# Patient Record
Sex: Female | Born: 1990 | Hispanic: Yes | Marital: Married | Smoking: Never smoker
Health system: Southern US, Community
[De-identification: ages and names within clinical notes are randomized; demographics above are authoritative.]

## PROBLEM LIST (undated history)

## (undated) DIAGNOSIS — K2970 Gastritis, unspecified, without bleeding: Secondary | ICD-10-CM

## (undated) DIAGNOSIS — O1490 Unspecified pre-eclampsia, unspecified trimester: Secondary | ICD-10-CM

---

## 2016-02-18 ENCOUNTER — Emergency Department (HOSPITAL_COMMUNITY): Payer: Self-pay

## 2016-02-18 ENCOUNTER — Emergency Department (HOSPITAL_COMMUNITY)
Admission: EM | Admit: 2016-02-18 | Discharge: 2016-02-18 | Disposition: A | Discharge status: Home / Self Care | Payer: Self-pay | Attending: Emergency Medicine | Admitting: Emergency Medicine

## 2016-02-18 ENCOUNTER — Encounter (HOSPITAL_COMMUNITY): Payer: Self-pay | Admitting: Emergency Medicine

## 2016-02-18 DIAGNOSIS — R1013 Epigastric pain: Secondary | ICD-10-CM | POA: Insufficient documentation

## 2016-02-18 LAB — URINE MICROSCOPIC-ADD ON

## 2016-02-18 LAB — URINALYSIS, ROUTINE W REFLEX MICROSCOPIC
Bilirubin Urine: NEGATIVE
GLUCOSE, UA: NEGATIVE mg/dL
HGB URINE DIPSTICK: NEGATIVE
Ketones, ur: NEGATIVE mg/dL
Nitrite: NEGATIVE
Protein, ur: NEGATIVE mg/dL
SPECIFIC GRAVITY, URINE: 1.017 (ref 1.005–1.030)
pH: 7.5 (ref 5.0–8.0)

## 2016-02-18 LAB — COMPREHENSIVE METABOLIC PANEL
ALT: 38 U/L (ref 14–54)
ANION GAP: 5 (ref 5–15)
AST: 22 U/L (ref 15–41)
Albumin: 4.3 g/dL (ref 3.5–5.0)
Alkaline Phosphatase: 71 U/L (ref 38–126)
BUN: 13 mg/dL (ref 6–20)
CHLORIDE: 106 mmol/L (ref 101–111)
CO2: 25 mmol/L (ref 22–32)
Calcium: 9.5 mg/dL (ref 8.9–10.3)
Creatinine, Ser: 0.71 mg/dL (ref 0.44–1.00)
Glucose, Bld: 107 mg/dL — ABNORMAL HIGH (ref 65–99)
POTASSIUM: 3.9 mmol/L (ref 3.5–5.1)
SODIUM: 136 mmol/L (ref 135–145)
Total Bilirubin: 0.2 mg/dL — ABNORMAL LOW (ref 0.3–1.2)
Total Protein: 7.5 g/dL (ref 6.5–8.1)

## 2016-02-18 LAB — I-STAT BETA HCG BLOOD, ED (MC, WL, AP ONLY)

## 2016-02-18 LAB — CBC
HEMATOCRIT: 39.4 % (ref 36.0–46.0)
HEMOGLOBIN: 13.4 g/dL (ref 12.0–15.0)
MCH: 30.5 pg (ref 26.0–34.0)
MCHC: 34 g/dL (ref 30.0–36.0)
MCV: 89.5 fL (ref 78.0–100.0)
Platelets: 210 10*3/uL (ref 150–400)
RBC: 4.4 MIL/uL (ref 3.87–5.11)
RDW: 13.3 % (ref 11.5–15.5)
WBC: 7.7 10*3/uL (ref 4.0–10.5)

## 2016-02-18 LAB — LIPASE, BLOOD: LIPASE: 16 U/L (ref 11–51)

## 2016-02-18 MED ORDER — GI COCKTAIL ~~LOC~~
30.0000 mL | Freq: Once | ORAL | Status: AC
  Administered 2016-02-18: 30 mL via ORAL
  Filled 2016-02-18: qty 30

## 2016-02-18 MED ORDER — SUCRALFATE 1 G PO TABS: 1.0000 g | ORAL_TABLET | Freq: Four times a day (QID) | ORAL | 0 refills | Status: DC

## 2016-02-18 MED ORDER — HYDROMORPHONE HCL 1 MG/ML IJ SOLN
1.0000 mg | Freq: Once | INTRAMUSCULAR | Status: AC
  Administered 2016-02-18: 1 mg via INTRAVENOUS
  Filled 2016-02-18: qty 1

## 2016-02-18 MED ORDER — SODIUM CHLORIDE 0.9 % IV BOLUS (SEPSIS)
1000.0000 mL | Freq: Once | INTRAVENOUS | Status: AC
  Administered 2016-02-18: 1000 mL via INTRAVENOUS

## 2016-02-18 MED ORDER — FAMOTIDINE 20 MG PO TABS
40.0000 mg | ORAL_TABLET | Freq: Once | ORAL | Status: AC
  Administered 2016-02-18: 40 mg via ORAL
  Filled 2016-02-18: qty 2

## 2016-02-18 MED ORDER — ONDANSETRON HCL 4 MG/2ML IJ SOLN
4.0000 mg | Freq: Once | INTRAMUSCULAR | Status: AC
  Administered 2016-02-18: 4 mg via INTRAVENOUS
  Filled 2016-02-18: qty 2

## 2016-02-18 MED ORDER — FAMOTIDINE 20 MG PO TABS: 20.0000 mg | ORAL_TABLET | Freq: Two times a day (BID) | ORAL | 0 refills | Status: DC

## 2016-02-18 MED ORDER — HYDROCODONE-ACETAMINOPHEN 5-325 MG PO TABS: 2.0000 | ORAL_TABLET | ORAL | 0 refills | Status: DC | PRN

## 2016-02-18 MED ORDER — SODIUM CHLORIDE 0.9 % IV SOLN: INTRAVENOUS | Status: DC

## 2016-02-18 MED ORDER — SUCRALFATE 1 G PO TABS
1.0000 g | ORAL_TABLET | Freq: Once | ORAL | Status: AC
  Administered 2016-02-18: 1 g via ORAL
  Filled 2016-02-18: qty 1

## 2016-02-18 NOTE — ED Triage Notes (Signed)
Pt reports epigastric pain for the past 2 weeks but worse today. Not on menstrual cycle. No nv/d. Pt speaks Spanish.

## 2016-02-18 NOTE — ED Notes (Signed)
US at bedside. Attempted to reassess pain, but US speaking with pt via translator. Pt appears more relaxed.

## 2016-02-18 NOTE — ED Notes (Signed)
Pt unable to urinate at present time 

## 2016-02-18 NOTE — ED Notes (Signed)
Pt reports her pain decreased a little

## 2016-02-18 NOTE — ED Provider Notes (Signed)
[facility]-EMERGENCY DEPT Provider Note   CSN: [id number] Arrival date & time: 02/18/16  [id number]     History   Chief Complaint Chief Complaint  Patient presents with  . Abdominal Pain    HPI Chelsey Kemp is a 25 y.o. female.  25 year old female presents with upper quadrant pain 2 weeks. Pain is sharp and radiates to her epigastric area into her back. Symptoms are worse with food.  In associated with nausea but no vomiting. No fever or chills. No vaginal bleeding or discharge. No urinary symptoms. No prior history of abdominal surgeries. Use over-the-counter medications without relief.      History reviewed. No pertinent past medical history.  There are no active problems to display for this patient.   History reviewed. No pertinent surgical history.  OB History    Gravida Para Term Preterm AB Living   1             SAB TAB Ectopic Multiple Live Births                   Home Medications    Prior to Admission medications   Not on File    Family History History reviewed. No pertinent family history.  Social History Social History  Substance Use Topics  . Smoking status: Never Smoker  . Smokeless tobacco: Never Used  . Alcohol use No     Allergies   Patient has no known allergies.   Review of Systems Review of Systems  All other systems reviewed and are negative.    Physical Exam Updated Vital Signs BP 122/83 (BP Location: Right Arm)   Pulse 80   Temp 98.2 F (36.8 C) (Oral)   Resp 18   LMP 01/31/2016   SpO2 100%   Physical Exam  Constitutional: She is oriented to person, place, and time. She appears well-developed and well-nourished.  Non-toxic appearance. No distress.  HENT:  Head: Normocephalic and atraumatic.  Eyes: Conjunctivae, EOM and lids are normal. Pupils are equal, round, and reactive to light.  Neck: Normal range of motion. Neck supple. No tracheal deviation present. No thyroid mass present.  Cardiovascular: Normal rate,  regular rhythm and normal heart sounds.  Exam reveals no gallop.   No murmur heard. Pulmonary/Chest: Effort normal and breath sounds normal. No stridor. No respiratory distress. She has no decreased breath sounds. She has no wheezes. She has no rhonchi. She has no rales.  Abdominal: Soft. Normal appearance and bowel sounds are normal. She exhibits no distension. There is tenderness in the right upper quadrant and epigastric area. There is guarding and positive Murphy's sign. There is no rebound and no CVA tenderness.    Musculoskeletal: Normal range of motion. She exhibits no edema or tenderness.  Neurological: She is alert and oriented to person, place, and time. She has normal strength. No cranial nerve deficit or sensory deficit. GCS eye subscore is 4. GCS verbal subscore is 5. GCS motor subscore is 6.  Skin: Skin is warm and dry. No abrasion and no rash noted.  Psychiatric: She has a normal mood and affect. Her speech is normal and behavior is normal.  Nursing note and vitals reviewed.    ED Treatments / Results  Labs (all labs ordered are listed, but only abnormal results are displayed) Labs Reviewed  LIPASE, BLOOD  COMPREHENSIVE METABOLIC PANEL  CBC  URINALYSIS, ROUTINE W REFLEX MICROSCOPIC (NOT AT [facility])  I-STAT BETA HCG BLOOD, ED ([facility], [facility], [facility] ONLY)    EKG  EKG  Interpretation None       Radiology No results found.  Procedures Procedures (including critical care time)  Medications Ordered in ED Medications  sodium chloride 0.9 % bolus 1,000 mL (not administered)  0.9 %  sodium chloride infusion (not administered)  ondansetron (ZOFRAN) injection 4 mg (not administered)  HYDROmorphone (DILAUDID) injection 1 mg (not administered)     Initial Impression / Assessment and Plan / ED Course  I have reviewed the triage vital signs and the nursing notes.  Pertinent labs & imaging results that were available during my care of the patient were reviewed by me and considered  in my medical decision making (see chart for details).  Clinical Course     Translator used for this encounter. Ultrasound without acute findings. Hyperkalemia noted. White cells noted in urine but patient has no symptoms at this time. Patient given medications for GERD and feels better. Will get GI referral  Final Clinical Impressions(s) / ED Diagnoses   Final diagnoses:  None    New Prescriptions New Prescriptions   No medications on file     Lorre NickAnthony Airabella Barley, MD 02/18/16 1517

## 2016-03-02 ENCOUNTER — Emergency Department (HOSPITAL_COMMUNITY): Payer: Self-pay

## 2016-03-02 ENCOUNTER — Encounter (HOSPITAL_COMMUNITY): Payer: Self-pay | Admitting: Emergency Medicine

## 2016-03-02 ENCOUNTER — Inpatient Hospital Stay (HOSPITAL_COMMUNITY)
Admission: EM | Admit: 2016-03-02 | Discharge: 2016-03-08 | DRG: 417 | Disposition: A | Discharge status: Home / Self Care | Payer: Self-pay | Attending: Internal Medicine | Admitting: Internal Medicine

## 2016-03-02 DIAGNOSIS — I959 Hypotension, unspecified: Secondary | ICD-10-CM | POA: Diagnosis present

## 2016-03-02 DIAGNOSIS — Z6833 Body mass index (BMI) 33.0-33.9, adult: Secondary | ICD-10-CM

## 2016-03-02 DIAGNOSIS — R7989 Other specified abnormal findings of blood chemistry: Secondary | ICD-10-CM

## 2016-03-02 DIAGNOSIS — K8051 Calculus of bile duct without cholangitis or cholecystitis with obstruction: Secondary | ICD-10-CM | POA: Diagnosis present

## 2016-03-02 DIAGNOSIS — K859 Acute pancreatitis without necrosis or infection, unspecified: Secondary | ICD-10-CM | POA: Diagnosis not present

## 2016-03-02 DIAGNOSIS — K8050 Calculus of bile duct without cholangitis or cholecystitis without obstruction: Secondary | ICD-10-CM

## 2016-03-02 DIAGNOSIS — R935 Abnormal findings on diagnostic imaging of other abdominal regions, including retroperitoneum: Secondary | ICD-10-CM

## 2016-03-02 DIAGNOSIS — K759 Inflammatory liver disease, unspecified: Secondary | ICD-10-CM | POA: Diagnosis present

## 2016-03-02 DIAGNOSIS — Z419 Encounter for procedure for purposes other than remedying health state, unspecified: Secondary | ICD-10-CM

## 2016-03-02 DIAGNOSIS — R112 Nausea with vomiting, unspecified: Secondary | ICD-10-CM

## 2016-03-02 DIAGNOSIS — R51 Headache: Secondary | ICD-10-CM | POA: Diagnosis not present

## 2016-03-02 DIAGNOSIS — K8067 Calculus of gallbladder and bile duct with acute and chronic cholecystitis with obstruction: Principal | ICD-10-CM | POA: Diagnosis present

## 2016-03-02 DIAGNOSIS — K8021 Calculus of gallbladder without cholecystitis with obstruction: Secondary | ICD-10-CM

## 2016-03-02 DIAGNOSIS — K8000 Calculus of gallbladder with acute cholecystitis without obstruction: Secondary | ICD-10-CM | POA: Diagnosis present

## 2016-03-02 DIAGNOSIS — R945 Abnormal results of liver function studies: Secondary | ICD-10-CM

## 2016-03-02 DIAGNOSIS — R1011 Right upper quadrant pain: Secondary | ICD-10-CM

## 2016-03-02 DIAGNOSIS — E669 Obesity, unspecified: Secondary | ICD-10-CM | POA: Diagnosis present

## 2016-03-02 DIAGNOSIS — K2970 Gastritis, unspecified, without bleeding: Secondary | ICD-10-CM | POA: Diagnosis present

## 2016-03-02 DIAGNOSIS — R1010 Upper abdominal pain, unspecified: Secondary | ICD-10-CM

## 2016-03-02 HISTORY — DX: Unspecified pre-eclampsia, unspecified trimester: O14.90

## 2016-03-02 HISTORY — DX: Gastritis, unspecified, without bleeding: K29.70

## 2016-03-02 LAB — CBC WITH DIFFERENTIAL/PLATELET
Basophils Absolute: 0 10*3/uL (ref 0.0–0.1)
Basophils Relative: 0 %
EOS PCT: 0 %
Eosinophils Absolute: 0 10*3/uL (ref 0.0–0.7)
HCT: 40.1 % (ref 36.0–46.0)
Hemoglobin: 13.6 g/dL (ref 12.0–15.0)
LYMPHS ABS: 1.2 10*3/uL (ref 0.7–4.0)
LYMPHS PCT: 23 %
MCH: 30.3 pg (ref 26.0–34.0)
MCHC: 33.9 g/dL (ref 30.0–36.0)
MCV: 89.3 fL (ref 78.0–100.0)
MONOS PCT: 4 %
Monocytes Absolute: 0.2 10*3/uL (ref 0.1–1.0)
Neutro Abs: 3.6 10*3/uL (ref 1.7–7.7)
Neutrophils Relative %: 73 %
PLATELETS: 221 10*3/uL (ref 150–400)
RBC: 4.49 MIL/uL (ref 3.87–5.11)
RDW: 13.2 % (ref 11.5–15.5)
WBC: 5 10*3/uL (ref 4.0–10.5)

## 2016-03-02 LAB — RAPID HIV SCREEN (HIV 1/2 AB+AG)
HIV 1/2 ANTIBODIES: NONREACTIVE
HIV-1 P24 Antigen - HIV24: NONREACTIVE

## 2016-03-02 LAB — COMPREHENSIVE METABOLIC PANEL
ALBUMIN: 4.9 g/dL (ref 3.5–5.0)
ALT: 880 U/L — ABNORMAL HIGH (ref 14–54)
AST: 1372 U/L — AB (ref 15–41)
Alkaline Phosphatase: 119 U/L (ref 38–126)
Anion gap: 5 (ref 5–15)
BUN: 13 mg/dL (ref 6–20)
CHLORIDE: 107 mmol/L (ref 101–111)
CO2: 27 mmol/L (ref 22–32)
Calcium: 9.5 mg/dL (ref 8.9–10.3)
Creatinine, Ser: 0.71 mg/dL (ref 0.44–1.00)
GFR calc Af Amer: >60 mL/min (ref >60)
GLUCOSE: 115 mg/dL — AB (ref 65–99)
POTASSIUM: 4.1 mmol/L (ref 3.5–5.1)
SODIUM: 139 mmol/L (ref 135–145)
Total Bilirubin: 1.1 mg/dL (ref 0.3–1.2)
Total Protein: 7.7 g/dL (ref 6.5–8.1)

## 2016-03-02 LAB — URINALYSIS, ROUTINE W REFLEX MICROSCOPIC
Glucose, UA: NEGATIVE mg/dL
Ketones, ur: 15 mg/dL — AB
NITRITE: POSITIVE — AB
PH: 7.5 (ref 5.0–8.0)
Protein, ur: 100 mg/dL — AB
SPECIFIC GRAVITY, URINE: 1.022 (ref 1.005–1.030)

## 2016-03-02 LAB — URINE MICROSCOPIC-ADD ON

## 2016-03-02 LAB — PROTIME-INR
INR: 1
Prothrombin Time: 13.2 seconds (ref 11.4–15.2)

## 2016-03-02 LAB — I-STAT BETA HCG BLOOD, ED (MC, WL, AP ONLY): I-stat hCG, quantitative: <5 m[IU]/mL (ref <5)

## 2016-03-02 LAB — ACETAMINOPHEN LEVEL: Acetaminophen (Tylenol), Serum: <10 ug/mL — ABNORMAL LOW (ref 10–30)

## 2016-03-02 LAB — LIPASE, BLOOD: LIPASE: 23 U/L (ref 11–51)

## 2016-03-02 LAB — MONONUCLEOSIS SCREEN: MONO SCREEN: NEGATIVE

## 2016-03-02 MED ORDER — PROMETHAZINE HCL 25 MG PO TABS
25.0000 mg | ORAL_TABLET | Freq: Four times a day (QID) | ORAL | Status: DC | PRN
  Filled 2016-03-02: qty 1

## 2016-03-02 MED ORDER — ENOXAPARIN SODIUM 40 MG/0.4ML ~~LOC~~ SOLN
40.0000 mg | SUBCUTANEOUS | Status: DC
  Administered 2016-03-02 – 2016-03-03 (×2): 40 mg via SUBCUTANEOUS
  Filled 2016-03-02 (×2): qty 0.4

## 2016-03-02 MED ORDER — HYDROMORPHONE HCL 1 MG/ML IJ SOLN
1.0000 mg | INTRAMUSCULAR | Status: DC | PRN
  Administered 2016-03-03: 1 mg via INTRAVENOUS
  Filled 2016-03-02: qty 1

## 2016-03-02 MED ORDER — ONDANSETRON HCL 4 MG/2ML IJ SOLN
4.0000 mg | Freq: Once | INTRAMUSCULAR | Status: AC
  Administered 2016-03-02: 4 mg via INTRAVENOUS
  Filled 2016-03-02: qty 2

## 2016-03-02 MED ORDER — ONDANSETRON HCL 4 MG PO TABS
4.0000 mg | ORAL_TABLET | Freq: Three times a day (TID) | ORAL | Status: DC | PRN
  Administered 2016-03-06 – 2016-03-08 (×3): 8 mg via ORAL
  Filled 2016-03-02 (×3): qty 2

## 2016-03-02 MED ORDER — SODIUM CHLORIDE 0.9 % IV SOLN
INTRAVENOUS | Status: DC
  Administered 2016-03-02: 1000 mL via INTRAVENOUS
  Administered 2016-03-03: 23:00:00 via INTRAVENOUS
  Administered 2016-03-03: 1000 mL via INTRAVENOUS

## 2016-03-02 MED ORDER — PROMETHAZINE HCL 25 MG/ML IJ SOLN
12.5000 mg | Freq: Four times a day (QID) | INTRAMUSCULAR | Status: DC | PRN
  Administered 2016-03-02 – 2016-03-07 (×5): 12.5 mg via INTRAVENOUS
  Filled 2016-03-02 (×6): qty 1

## 2016-03-02 MED ORDER — SODIUM CHLORIDE 0.9 % IV BOLUS (SEPSIS)
1000.0000 mL | Freq: Once | INTRAVENOUS | Status: AC
  Administered 2016-03-02: 1000 mL via INTRAVENOUS

## 2016-03-02 MED ORDER — HYDROMORPHONE HCL 1 MG/ML IJ SOLN
1.0000 mg | Freq: Once | INTRAMUSCULAR | Status: AC
  Administered 2016-03-02: 1 mg via INTRAVENOUS
  Filled 2016-03-02: qty 1

## 2016-03-02 MED ORDER — SODIUM CHLORIDE 0.9 % IV SOLN
Freq: Once | INTRAVENOUS | Status: AC
  Administered 2016-03-02: 20:00:00 via INTRAVENOUS

## 2016-03-02 MED ORDER — PROMETHAZINE HCL 25 MG RE SUPP
25.0000 mg | Freq: Four times a day (QID) | RECTAL | Status: DC | PRN
  Filled 2016-03-02: qty 1

## 2016-03-02 MED ORDER — IBUPROFEN 200 MG PO TABS
400.0000 mg | ORAL_TABLET | Freq: Four times a day (QID) | ORAL | Status: DC | PRN
  Administered 2016-03-02 – 2016-03-03 (×2): 400 mg via ORAL
  Filled 2016-03-02 (×2): qty 2

## 2016-03-02 NOTE — H&P (Signed)
History and Physical  Patient Name: Chelsey Kemp     ZOX:[id number]    DOB: 05-Jan-1991    DOA: 03/02/2016 PCP: No PCP Per Patient   Patient coming from: Home  Chief Complaint: RUQ Abdominal pain  HPI: Chelsey Kemp is a 25 y.o. female with no significant past medical history who presents with 3 weeks right upper quadrant abdominal pain.  All history collected through telephonic video interpreter.  The patient was in her usual state of health until about one month ago when she started to develop discomfort in her right upper quadrant. This was initially moderate in intensity, and intermittent. She went to the emergency room where a right upper quadrant ultrasound was negative, LFTs were normal and she was discharged with sucralfate and antacid.  The pain persisted despite these treatments, and on the last 3 days, the pain has become intense, constant, worse with lying down, worse after eating, and associated with vomiting. She could no longer bear the pain so she came back to the ER.  ED course: -Afebrile, heart rate 60s, respirations and pulse ox normal, blood pressure 114/55 -Na 139, K 4.1, Cr 0.71, WBC 5K, Hgb 13.6 -AST and ALT were 1372 and 880, respectively, total bilirubin and alkaline phosphatase normal. -CT of the abdomen and pelvis showed gallstones but no other abnormality -Right upper quadrant ultrasound showed normal CBD and no gallstones or evidence of cholecystitis -The case was discussed with gastroenterology who recommended admission for pain control and fluids, and TRH was asked to evaluate    The patient denies Tylenol use. She is from [place], has lived in the [place] last 3 years. She believes that she was vaccinated for hepatitis A and B. She has never had a tattoo, used injection drugs, or exchanged sex for money. She has had no previous gallstones, and no one of her family has had any history of liver disease or gallbladder  disease.          ROS: Review of Systems  Constitutional: Negative for chills, fever, malaise/fatigue and weight loss.  Gastrointestinal: Positive for abdominal pain and vomiting. Negative for nausea.  Musculoskeletal: Negative for joint pain and myalgias.  Skin: Negative for rash (nor jaundice).  Neurological: Negative for dizziness and loss of consciousness.  Endo/Heme/Allergies: Does not bruise/bleed easily (nor lymphadenopathy).  All other systems reviewed and are negative.         Past Medical History:  Diagnosis Date  . Gastritis   . Pre-eclampsia     Past Surgical History:  Procedure Laterality Date  . CESAREAN SECTION      Social History: The patient lives with her family. She walks unassisted. She is from [place], has lived in [place] for 3 years.  Nonsmoker.  No alcohol.  No Known Allergies  Family history: Mother and father are healthy.  Prior to Admission medications   Medication Sig Start Date End Date Taking? Authorizing Provider  famotidine (PEPCID) 20 MG tablet Take 1 tablet (20 mg total) by mouth 2 (two) times daily. 02/18/16  Yes Lorre NickAnthony Allen, MD  HYDROcodone-acetaminophen (NORCO/VICODIN) 5-325 MG tablet Take 2 tablets by mouth every 4 (four) hours as needed. Patient not taking: Reported on 03/02/2016 02/18/16   Lorre NickAnthony Allen, MD  sucralfate (CARAFATE) 1 g tablet Take 1 tablet (1 g total) by mouth 4 (four) times daily. Patient not taking: Reported on 03/02/2016 02/18/16   Lorre NickAnthony Allen, MD       Physical Exam: BP 119/78 (BP Location: Left Arm)  Pulse 78   Temp 98.2 F (36.8 C) (Oral)   Resp 18   LMP 02/28/2016 Comment: pt does not speak english  SpO2 98%   Breastfeeding? Unknown Comment: neg preg test 03/02/2016  General appearance: Well-developed, obese adult female, alert and in no acute distress.   Eyes: Anicteric, conjunctiva pink, lids and lashes normal. PERRL.    ENT: No nasal deformity, discharge, epistaxis.  Hearing  normal. OP moist without lesions.   Neck: No neck masses.  Trachea midline.  No thyromegaly/tenderness. Lymph: No cervical or supraclavicular lymphadenopathy. Skin: Warm and dry.  No jaundice.  No suspicious rashes or lesions. Cardiac: RRR, nl S1-S2, no murmurs appreciated.  Capillary refill is brisk.  JVP not visible.  No LE edema.  Radial and DP pulses 2+ and symmetric. Respiratory: Normal respiratory rate and rhythm.  CTAB without rales or wheezes. Abdomen: Abdomen soft.  Moderate TTP in RUQ, no guarding or rebound or rigidity. No ascites, distension, hepatosplenomegaly.   MSK: No deformities or effusions.  No cyanosis or clubbing. Neuro: Cranial nerves normal.  Sensation intact to light touch. Speech is fluent.  Muscle strength normal.    Psych: Sensorium intact and responding to questions, attention normal.  Behavior appropriate.  Affect normal.  Judgment and insight appear normal.     Labs on Admission:  I have personally reviewed following labs and imaging studies: CBC:  Recent Labs Lab 03/02/16 1504  WBC 5.0  NEUTROABS 3.6  HGB 13.6  HCT 40.1  MCV 89.3  PLT 221   Basic Metabolic Panel:  Recent Labs Lab 03/02/16 1504  NA 139  K 4.1  CL 107  CO2 27  GLUCOSE 115*  BUN 13  CREATININE 0.71  CALCIUM 9.5   GFR: CrCl cannot be calculated (Unknown ideal weight.).  Liver Function Tests:  Recent Labs Lab 03/02/16 1504  AST 1,372*  ALT 880*  ALKPHOS 119  BILITOT 1.1  PROT 7.7  ALBUMIN 4.9    Recent Labs Lab 03/02/16 1900  LIPASE 23   No results for input(s): AMMONIA in the last 168 hours. Coagulation Profile:  Recent Labs Lab 03/02/16 1900  INR 1.00   Cardiac Enzymes: No results for input(s): CKTOTAL, CKMB, CKMBINDEX, TROPONINI in the last 168 hours. BNP (last 3 results) No results for input(s): PROBNP in the last 8760 hours. HbA1C: No results for input(s): HGBA1C in the last 72 hours. CBG: No results for input(s): GLUCAP in the last 168  hours. Lipid Profile: No results for input(s): CHOL, HDL, LDLCALC, TRIG, CHOLHDL, LDLDIRECT in the last 72 hours. Thyroid Function Tests: No results for input(s): TSH, T4TOTAL, FREET4, T3FREE, THYROIDAB in the last 72 hours. Anemia Panel: No results for input(s): VITAMINB12, FOLATE, FERRITIN, TIBC, IRON, RETICCTPCT in the last 72 hours. Sepsis Labs: Invalid input(s): PROCALCITONIN, LACTICIDVEN No results found for this or any previous visit (from the past 240 hour(s)).       Radiological Exams on Admission: Personally reviewed CT without contrast and RUQ US reports: Ct Renal Stone Study  Result Date: 03/02/2016 CLINICAL DATA:  Right flank pain. EXAM: CT ABDOMEN AND PELVIS WITHOUT CONTRAST TECHNIQUE: Multidetector CT imaging of the abdomen and pelvis was performed following the standard protocol without IV contrast. COMPARISON:  Abdominal ultrasound 02/18/2016 FINDINGS: Lower chest: Trace bilateral pleural fluid. No visualized pneumonia. Hepatobiliary: Negative liver. Cholelithiasis with calcified stone seen near the neck. Gallbladder is full but there is no convincing pericholecystic inflammation. Pancreas: Normal Spleen: Normal Adrenals/Urinary Tract: Negative adrenal glands. No hydronephrosis or renal  calculus. Negative urinary bladder. Stomach/Bowel: No obstruction or inflammation.  Normal appendix. Vascular/Lymphatic: Negative Reproductive: Negative Other: The lower peritoneum may be mildly thickened, suggesting previous inflammation. No ascites or pneumoperitoneum. Musculoskeletal: Negative IMPRESSION: 1. Cholelithiasis without evidence of cholecystitis. 2. Trace bilateral pleural fluid. Electronically Signed   By: Marnee Spring M.D.   On: 03/02/2016 17:18   US Abdomen Limited Ruq  Result Date: 03/02/2016 CLINICAL DATA:  Subacute onset of right upper quadrant abdominal pain. Initial encounter. EXAM: US ABDOMEN LIMITED - RIGHT UPPER QUADRANT COMPARISON:  CT of the abdomen and pelvis  performed earlier today at 5:00 p.m. FINDINGS: Gallbladder: Multiple stones are noted within the gallbladder, measuring up to 9 mm in size. No gallbladder wall thickening or pericholecystic fluid is seen. Mild sludge is noted within the gallbladder. No ultrasonographic Murphy's sign is elicited. Common bile duct: Diameter: 0.3 cm, within normal limits in caliber. Liver: No focal lesion identified. Within normal limits in parenchymal echogenicity. The left hepatic lobe is not well characterized due to overlying bowel gas. IMPRESSION: No evidence of cholecystitis or obstruction. Cholelithiasis and mild sludge within the gallbladder. Electronically Signed   By: Roanna Raider M.D.   On: 03/02/2016 19:32        Assessment/Plan  1. Hepatitis:  Working diagnosis is gallstone pancreatitis given gallstones seen on CT imaging and pain after eating.   -NPO and MIVF -Hydromorphone or ibuprofen for pain -Ondansetron or phenergan for nausea -Consult to Gastroenterology, appreciate recommendations -Trend CMP  -Follow lipase  -Check acetaminophen level -Check UDS -Check HIV -Follow viral hepatitis serologies -Check INR -Follow EBV screen          DVT prophylaxis: Lovenox  Code Status: FULL  Family Communication: None present  Disposition Plan: Anticipate GI consult tomorrow and presumably MRCP given normal RUQ [place].   Consults called: GI, Dr. Adela Lank Admission status: OBS, med surg At the point of initial evaluation, it is my clinical opinion that admission for OBSERVATION is reasonable and necessary because the patient's presenting complaints in the context of their chronic conditions represent sufficient risk of deterioration or significant morbidity to constitute reasonable grounds for close observation in the hospital setting, but that the patient may be medically stable for discharge from the hospital within 24 to 48 hours.    Medical decision making: Patient seen at 7:25 PM on  03/02/2016.  The patient was discussed with Dr. Donnald Garre.  What exists of the patient's chart was reviewed in depth and summarized above.  Clinical condition: stable.        Alberteen Sam [facility] Pager [phone]

## 2016-03-02 NOTE — ED Notes (Signed)
Writer attempted to assist pt to the bathroom for a urine sample, pt made hand motions suggesting that she wasn't feeling any better.

## 2016-03-02 NOTE — ED Provider Notes (Signed)
[facility]-EMERGENCY DEPT Provider Note   CSN: [id number] Arrival date & time: 03/02/16  1408     History   Chief Complaint Chief Complaint  Patient presents with  . Abdominal Pain    HPI Chelsey Kemp is a 25 y.o. female.  HPI Patient reports she has had pain in the right upper quadrant periodically for about a month. She reports she was seen in the hospital for this and has tried Nexium. She reports it has not helped at all. She however describes taking it periodically and not continuously. She reports the pain has become severe over the past 2-3 days. She reports when she gets severe episodes she vomits a little bit. She denies constipation or diarrhea. She denies pain with urination. She reports she is currently having a normal menstrual cycle. No fever. No cough. Pain is worse with deep inspiration and movements. Past Medical History:  Diagnosis Date  . Gastritis   . Pre-eclampsia     Patient Active Problem List   Diagnosis Date Noted  . Gall stones, common bile duct   . Choledocholithiasis with obstruction 03/04/2016  . Gastritis   . Non-intractable vomiting with nausea   . Elevated LFTs   . RUQ pain   . Acute cholecystitis s/p lap cholecystectomy 03/04/2016 03/02/2016    Past Surgical History:  Procedure Laterality Date  . CESAREAN SECTION    . LAPAROSCOPIC CHOLECYSTECTOMY W/ CHOLANGIOGRAPHY  03/04/2016   Dr Gaynelle Adu    OB History    Gravida Para Term Preterm AB Living   1             SAB TAB Ectopic Multiple Live Births                   Home Medications    Prior to Admission medications   Medication Sig Start Date End Date Taking? Authorizing Provider  famotidine (PEPCID) 20 MG tablet Take 1 tablet (20 mg total) by mouth 2 (two) times daily. 02/18/16  Yes Lorre Nick, MD    Family History History reviewed. No pertinent family history.  Social History Social History  Substance Use Topics  . Smoking status: Never Smoker  . Smokeless tobacco:  Never Used  . Alcohol use No     Allergies   Patient has no known allergies.   Review of Systems Review of Systems 10 Systems reviewed and are negative for acute change except as noted in the HPI.   Physical Exam Updated Vital Signs BP (!) 104/54 (BP Location: Right Arm)   Pulse (!) 52   Temp 98.6 F (37 C) (Oral)   Resp 16   Ht 5' (1.524 m)   Wt 169 lb (76.7 kg)   LMP 02/28/2016 Comment: pt does not speak english  SpO2 100%   Breastfeeding? Unknown Comment: neg preg test 03/02/2016  BMI 33.01 kg/m   Physical Exam  Constitutional: She is oriented to person, place, and time. She appears well-developed and well-nourished. No distress.  Patient is clinically well in appearance but appears to be in moderate to severe pain, holding her right upper quadrant.  HENT:  Head: Normocephalic and atraumatic.  Eyes: Conjunctivae and EOM are normal.  Neck: Neck supple.  Cardiovascular: Normal rate and regular rhythm.   No murmur heard. Pulmonary/Chest: Effort normal and breath sounds normal. No respiratory distress.  Abdominal: Soft. There is tenderness.  Moderate to severe epigastric and right upper quadrant tenderness. Lower abdomen is nontender. Positive right CVA tenderness. Patient also expresses pain with  sitting forward and moving back.  Musculoskeletal: Normal range of motion. She exhibits no tenderness.  Neurological: She is alert and oriented to person, place, and time. She exhibits normal muscle tone. Coordination normal.  Skin: Skin is warm and dry.  Psychiatric: She has a normal mood and affect.  Nursing note and vitals reviewed.    ED Treatments / Results  Labs (all labs ordered are listed, but only abnormal results are displayed) Labs Reviewed  URINE CULTURE - Abnormal; Notable for the following:       Result Value   Culture MULTIPLE SPECIES PRESENT, SUGGEST RECOLLECTION (*)    All other components within normal limits  COMPREHENSIVE METABOLIC PANEL -  Abnormal; Notable for the following:    Glucose, Bld 115 (*)    AST 1,372 (*)    ALT 880 (*)    All other components within normal limits  URINALYSIS, ROUTINE W REFLEX MICROSCOPIC (NOT AT [facility]) - Abnormal; Notable for the following:    Color, Urine RED (*)    APPearance TURBID (*)    Hgb urine dipstick LARGE (*)    Bilirubin Urine MODERATE (*)    Ketones, ur 15 (*)    Protein, ur 100 (*)    Nitrite POSITIVE (*)    Leukocytes, UA MODERATE (*)    All other components within normal limits  URINE MICROSCOPIC-ADD ON - Abnormal; Notable for the following:    Squamous Epithelial / LPF 6-30 (*)    Bacteria, UA FEW (*)    All other components within normal limits  ACETAMINOPHEN LEVEL - Abnormal; Notable for the following:    Acetaminophen (Tylenol), Serum <10 (*)    All other components within normal limits  RAPID URINE DRUG SCREEN, HOSP PERFORMED - Abnormal; Notable for the following:    Opiates POSITIVE (*)    All other components within normal limits  COMPREHENSIVE METABOLIC PANEL - Abnormal; Notable for the following:    Calcium 8.3 (*)    Total Protein 5.8 (*)    Albumin 3.4 (*)    AST 410 (*)    ALT 665 (*)    Total Bilirubin 1.6 (*)    Anion gap 3 (*)    All other components within normal limits  CBC - Abnormal; Notable for the following:    Hemoglobin 11.8 (*)    HCT 35.0 (*)    All other components within normal limits  COMPREHENSIVE METABOLIC PANEL - Abnormal; Notable for the following:    AST 170 (*)    ALT 464 (*)    Alkaline Phosphatase 145 (*)    Total Bilirubin 3.4 (*)    All other components within normal limits  CBC - Abnormal; Notable for the following:    WBC 10.8 (*)    HCT 35.9 (*)    All other components within normal limits  SURGICAL PCR SCREEN  CBC WITH DIFFERENTIAL/PLATELET  HEPATITIS PANEL, ACUTE  PROTIME-INR  LIPASE, BLOOD  MONONUCLEOSIS SCREEN  RAPID HIV SCREEN (HIV 1/2 AB+AG)  COMPREHENSIVE METABOLIC PANEL  I-STAT BETA HCG BLOOD, ED ([facility], [facility],  [facility] ONLY)  TYPE AND SCREEN  ABO/RH  SURGICAL PATHOLOGY    EKG  EKG Interpretation None       Radiology Dg Cholangiogram Operative  Result Date: 03/04/2016 CLINICAL DATA:  25 year old female with a history of cholecystectomy EXAM: INTRAOPERATIVE CHOLANGIOGRAM TECHNIQUE: Cholangiographic images from the C-arm fluoroscopic device were submitted for interpretation post-operatively. Please see the procedural report for the amount of contrast and the fluoroscopy time  utilized. COMPARISON:  Ultrasound 03/02/2016, CT 03/02/2016 FINDINGS: Surgical instruments project over the upper abdomen. There is cannulation of the cystic duct/gallbladder neck, with antegrade infusion of contrast. Caliber of the extrahepatic ductal system within normal limits. Vague filling defects within the common bile duct. Contrast does not cross the ampulla into the duodenum. Extraluminal contrast at the site of cystic duct cannulation and at the inferior liver margin. IMPRESSION: Intraoperative cholangiogram demonstrates no contrast traversing the ampulla with vague filling defects within the common bile duct potentially representing retained debris/ stones. Please refer to the dictated operative report for full details of intraoperative findings and procedure Signed, Yvone Neu. Loreta Ave, DO [facility] Radiology Electronically Signed   By: Gilmer Mor D.O.   On: 03/04/2016 09:20   Dg Ercp Biliary & Pancreatic Ducts  Result Date: 03/05/2016 CLINICAL DATA:  25 year old female undergoing sphincterotomy for stone removal EXAM: ERCP TECHNIQUE: Multiple spot images obtained with the fluoroscopic device and submitted for interpretation post-procedure. FLUOROSCOPY TIME:  Fluoroscopy Time:  6 minutes 31 seconds Radiation Exposure Index (if provided by the fluoroscopic device): 102.26 mGy COMPARISON:  Intraoperative cholangiogram 03/04/2016 FINDINGS: A total of 4 intraoperative spot images  demonstrate a flexible endoscope in the duodenum with cannulation of the common bile duct and contrast opacification. There is a rounded filling defect in the distal common duct consistent with choledocholithiasis. Subsequent images demonstrate sphincterotomy and balloon sweep of the common duct. IMPRESSION: 1. Choledocholithiasis with a small stone in the distal common bile duct. 2. Sphincterotomy and balloon sweep of the common duct. These images were submitted for radiologic interpretation only. Please see the procedural report for the amount of contrast and the fluoroscopy time utilized. Electronically Signed   By: Malachy Moan M.D.   On: 03/05/2016 14:56    Procedures Procedures (including critical care time)  Medications Ordered in ED Medications  ibuprofen (ADVIL,MOTRIN) tablet 400 mg ( Oral MAR Unhold 03/05/16 1437)  ondansetron (ZOFRAN) tablet 4-8 mg ( Oral MAR Unhold 03/05/16 1437)  promethazine (PHENERGAN) tablet 25 mg ( Oral MAR Unhold 03/05/16 1437)    Or  promethazine (PHENERGAN) injection 12.5 mg ( Intravenous MAR Unhold 03/05/16 1437)    Or  promethazine (PHENERGAN) suppository 25 mg ( Rectal MAR Unhold 03/05/16 1437)  Influenza vac split quadrivalent PF (FLUARIX) injection 0.5 mL ( Intramuscular MAR Unhold 03/05/16 1437)  0.9 %  sodium chloride infusion ( Intravenous Restarted 03/05/16 1451)  HYDROmorphone (DILAUDID) injection 0.5-1.5 mg (1 mg Intravenous Given 03/06/16 0006)  acetaminophen (TYLENOL) tablet 650 mg ( Oral MAR Unhold 03/05/16 1437)  HYDROmorphone (DILAUDID) 1 MG/ML injection (not administered)  cefTRIAXone (ROCEPHIN) 1 g in dextrose 5 % 50 mL IVPB (1 g Intravenous Given 03/05/16 1700)  HYDROmorphone (DILAUDID) injection 1 mg (1 mg Intravenous Given 03/02/16 1505)  sodium chloride 0.9 % bolus 1,000 mL (0 mLs Intravenous Stopped 03/02/16 1713)  ondansetron (ZOFRAN) injection 4 mg (4 mg Intravenous Given 03/02/16 1713)  0.9 %  sodium chloride infusion (  Intravenous New Bag/Given 03/02/16 1935)  cefoTEtan (CEFOTAN) 2 g in dextrose 5 % 50 mL IVPB ( Intravenous MAR Unhold 03/04/16 1027)     Initial Impression / Assessment and Plan / ED Course  I have reviewed the triage vital signs and the nursing notes.  Pertinent labs & imaging results that were available during my care of the patient were reviewed by me and considered in my medical decision making (see chart for details).  Clinical Course    Consult:(18:53)  Discussed with Dr. Adela LankArmbruster. Will add acute hepatitis panel and ultrasound. Plan to admit to medicine service. Nothing by mouth with pain control. She will be seen in the morning for further evaluation and possible MRCP.  Final Clinical Impressions(s) / ED Diagnoses   Final diagnoses:  RUQ pain  Abnormal CT of the abdomen  Surgery, elective  Hepatitis  Gall stones, common bile duct  Choledocholithiasis with obstruction    New Prescriptions Current Discharge Medication List       Arby BarretteMarcy Ashvik Grundman, MD 03/06/16 [id number]

## 2016-03-02 NOTE — ED Notes (Signed)
Patient ambulatory to the restroom.  Obtained urine specimen.

## 2016-03-02 NOTE — ED Triage Notes (Signed)
Pt reports through interpreter services that she began to have epigastric pain about 2 weeks ago, she was seen in the ED and received an Rx.  Pain improved with meds.  It began to worsen yesterday.  Pain is typically worse at night.  Pt has no PCP

## 2016-03-03 DIAGNOSIS — R112 Nausea with vomiting, unspecified: Secondary | ICD-10-CM

## 2016-03-03 DIAGNOSIS — R748 Abnormal levels of other serum enzymes: Secondary | ICD-10-CM

## 2016-03-03 DIAGNOSIS — R1011 Right upper quadrant pain: Secondary | ICD-10-CM

## 2016-03-03 DIAGNOSIS — R1010 Upper abdominal pain, unspecified: Secondary | ICD-10-CM

## 2016-03-03 DIAGNOSIS — R935 Abnormal findings on diagnostic imaging of other abdominal regions, including retroperitoneum: Secondary | ICD-10-CM

## 2016-03-03 DIAGNOSIS — K8020 Calculus of gallbladder without cholecystitis without obstruction: Secondary | ICD-10-CM

## 2016-03-03 LAB — RAPID URINE DRUG SCREEN, HOSP PERFORMED
AMPHETAMINES: NOT DETECTED
BARBITURATES: NOT DETECTED
BENZODIAZEPINES: NOT DETECTED
COCAINE: NOT DETECTED
OPIATES: POSITIVE — AB
TETRAHYDROCANNABINOL: NOT DETECTED

## 2016-03-03 LAB — COMPREHENSIVE METABOLIC PANEL
ALBUMIN: 3.4 g/dL — AB (ref 3.5–5.0)
ALT: 665 U/L — ABNORMAL HIGH (ref 14–54)
ANION GAP: 3 — AB (ref 5–15)
AST: 410 U/L — ABNORMAL HIGH (ref 15–41)
Alkaline Phosphatase: 120 U/L (ref 38–126)
BILIRUBIN TOTAL: 1.6 mg/dL — AB (ref 0.3–1.2)
BUN: 9 mg/dL (ref 6–20)
CO2: 24 mmol/L (ref 22–32)
Calcium: 8.3 mg/dL — ABNORMAL LOW (ref 8.9–10.3)
Chloride: 111 mmol/L (ref 101–111)
Creatinine, Ser: 0.5 mg/dL (ref 0.44–1.00)
Glucose, Bld: 94 mg/dL (ref 65–99)
POTASSIUM: 4 mmol/L (ref 3.5–5.1)
Sodium: 138 mmol/L (ref 135–145)
TOTAL PROTEIN: 5.8 g/dL — AB (ref 6.5–8.1)

## 2016-03-03 LAB — CBC
HEMATOCRIT: 35 % — AB (ref 36.0–46.0)
HEMOGLOBIN: 11.8 g/dL — AB (ref 12.0–15.0)
MCH: 30.3 pg (ref 26.0–34.0)
MCHC: 33.7 g/dL (ref 30.0–36.0)
MCV: 90 fL (ref 78.0–100.0)
Platelets: 184 10*3/uL (ref 150–400)
RBC: 3.89 MIL/uL (ref 3.87–5.11)
RDW: 13.4 % (ref 11.5–15.5)
WBC: 4 10*3/uL (ref 4.0–10.5)

## 2016-03-03 LAB — ABO/RH: ABO/RH(D): O POS

## 2016-03-03 LAB — TYPE AND SCREEN
ABO/RH(D): O POS
ANTIBODY SCREEN: NEGATIVE

## 2016-03-03 MED ORDER — INFLUENZA VAC SPLIT QUAD 0.5 ML IM SUSY
0.5000 mL | PREFILLED_SYRINGE | INTRAMUSCULAR | Status: DC
  Filled 2016-03-03: qty 0.5

## 2016-03-03 MED ORDER — CEFOTETAN DISODIUM 2 G IJ SOLR
2.0000 g | INTRAMUSCULAR | Status: AC
  Administered 2016-03-04: 2 g via INTRAVENOUS
  Filled 2016-03-03: qty 2

## 2016-03-03 NOTE — Consult Note (Signed)
Reason for Consult:biliary colic Referring Physician: Dr Lenice Pressman Chelsey Kemp is an 25 y.o. female.  HPI: This is a 25 year old non-English-speaking Hispanic female who reports about a 2 week history of upper abdominal pain mainly in the upper abdomen more so on the right side. It is associated with nausea at times. She has had vomiting at times with it. She went to the emergency room on November 10 with the same complaints. An ultrasound during that visit was unremarkable for cholelithiasis and she was discharged with PPI for presumed gastritis. She came back in last night for recurrent upper abdominal right-sided pain. It was worsening. She was found to have very elevated transaminases but a normal white blood cell count. Ultrasound this time showed numerous gallstones without evidence of choledocholithiasis or dilated common bile duct. CT also showed gallstones without any other abnormal pathology. Today her LFTs are trending down and it was felt that she had passed a gallstone and we were asked to evaluate her for symptomatic cholelithiasis  History obtained via video interpreter services Cristian [phone]  Past Medical History:  Diagnosis Date  . Gastritis   . Pre-eclampsia     Past Surgical History:  Procedure Laterality Date  . CESAREAN SECTION      History reviewed. No pertinent family history.  Social History:  reports that she has never smoked. She has never used smokeless tobacco. She reports that she does not drink alcohol. Her drug history is not on file.  Allergies: No Known Allergies  Medications: I have reviewed the patient's current medications.  Results for orders placed or performed during the hospital encounter of 03/02/16 (from the past 48 hour(s))  Comprehensive metabolic panel     Status: Abnormal   Collection Time: 03/02/16  3:04 PM  Result Value Ref Range   Sodium 139 135 - 145 mmol/L   Potassium 4.1 3.5 - 5.1 mmol/L   Chloride 107 101 - 111 mmol/L   CO2 27 22 - 32 mmol/L   Glucose, Bld 115 (H) 65 - 99 mg/dL   BUN 13 6 - 20 mg/dL   Creatinine, Ser 0.71 0.44 - 1.00 mg/dL   Calcium 9.5 8.9 - 10.3 mg/dL   Total Protein 7.7 6.5 - 8.1 g/dL   Albumin 4.9 3.5 - 5.0 g/dL   AST 1,372 (H) 15 - 41 U/L   ALT 880 (H) 14 - 54 U/L   Alkaline Phosphatase 119 38 - 126 U/L   Total Bilirubin 1.1 0.3 - 1.2 mg/dL   GFR calc non Af Amer >60 >60 mL/min   GFR calc Af Amer >60 >60 mL/min    Comment: (NOTE) The eGFR has been calculated using the CKD EPI equation. This calculation has not been validated in all clinical situations. eGFR's persistently <60 mL/min signify possible Chronic Kidney Disease.    Anion gap 5 5 - 15  CBC with Differential     Status: None   Collection Time: 03/02/16  3:04 PM  Result Value Ref Range   WBC 5.0 4.0 - 10.5 K/uL   RBC 4.49 3.87 - 5.11 MIL/uL   Hemoglobin 13.6 12.0 - 15.0 g/dL   HCT 40.1 36.0 - 46.0 %   MCV 89.3 78.0 - 100.0 fL   MCH 30.3 26.0 - 34.0 pg   MCHC 33.9 30.0 - 36.0 g/dL   RDW 13.2 11.5 - 15.5 %   Platelets 221 150 - 400 K/uL   Neutrophils Relative % 73 %   Neutro Abs 3.6 1.7 - 7.7  K/uL   Lymphocytes Relative 23 %   Lymphs Abs 1.2 0.7 - 4.0 K/uL   Monocytes Relative 4 %   Monocytes Absolute 0.2 0.1 - 1.0 K/uL   Eosinophils Relative 0 %   Eosinophils Absolute 0.0 0.0 - 0.7 K/uL   Basophils Relative 0 %   Basophils Absolute 0.0 0.0 - 0.1 K/uL  I-Stat beta hCG blood, ED     Status: None   Collection Time: 03/02/16  3:25 PM  Result Value Ref Range   I-stat hCG, quantitative <5.0 <5 mIU/mL   Comment 3            Comment:   GEST. AGE      CONC.  (mIU/mL)   <=1 WEEK        5 - 50     2 WEEKS       50 - 500     3 WEEKS       100 - 10,000     4 WEEKS     1,000 - 30,000        FEMALE AND NON-PREGNANT FEMALE:     LESS THAN 5 mIU/mL   Protime-INR     Status: None   Collection Time: 03/02/16  7:00 PM  Result Value Ref Range   Prothrombin Time 13.2 11.4 - 15.2 seconds   INR 1.00   Type and screen  [place]     Status: None   Collection Time: 03/02/16  7:00 PM  Result Value Ref Range   ABO/RH(D) O POS    Antibody Screen NEG    Sample Expiration 03/05/2016   Lipase, blood     Status: None   Collection Time: 03/02/16  7:00 PM  Result Value Ref Range   Lipase 23 11 - 51 U/L  Mononucleosis screen     Status: None   Collection Time: 03/02/16  7:00 PM  Result Value Ref Range   Mono Screen NEGATIVE NEGATIVE  ABO/Rh     Status: None   Collection Time: 03/02/16  7:01 PM  Result Value Ref Range   ABO/RH(D) O POS   Rapid HIV screen (HIV 1/2 Ab+Ag)     Status: None   Collection Time: 03/02/16  7:01 PM  Result Value Ref Range   HIV-1 P24 Antigen - HIV24 NON REACTIVE NON REACTIVE   HIV 1/2 Antibodies NON REACTIVE NON REACTIVE   Interpretation (HIV Ag Ab)      A non reactive test result means that HIV 1 or HIV 2 antibodies and HIV 1 p24 antigen were not detected in the specimen.    Comment: RESULT CALLED TO, READ BACK BY AND VERIFIED WITH: A DAVY RN [id number] 03/02/16 A NAVARRO   Urinalysis, Routine w reflex microscopic     Status: Abnormal   Collection Time: 03/02/16  7:22 PM  Result Value Ref Range   Color, Urine RED (A) YELLOW    Comment: BIOCHEMICALS MAY BE AFFECTED BY COLOR   APPearance TURBID (A) CLEAR   Specific Gravity, Urine 1.022 1.005 - 1.030   pH 7.5 5.0 - 8.0   Glucose, UA NEGATIVE NEGATIVE mg/dL   Hgb urine dipstick LARGE (A) NEGATIVE   Bilirubin Urine MODERATE (A) NEGATIVE   Ketones, ur 15 (A) NEGATIVE mg/dL   Protein, ur 100 (A) NEGATIVE mg/dL   Nitrite POSITIVE (A) NEGATIVE   Leukocytes, UA MODERATE (A) NEGATIVE  Urine microscopic-add on     Status: Abnormal   Collection Time: 03/02/16  7:22 PM  Result Value Ref Range   Squamous Epithelial / LPF 6-30 (A) NONE SEEN   WBC, UA 6-30 0 - 5 WBC/hpf   RBC / HPF TOO NUMEROUS TO COUNT 0 - 5 RBC/hpf   Bacteria, UA FEW (A) NONE SEEN  Rapid urine drug screen (hospital performed)     Status: Abnormal    Collection Time: 03/02/16  7:22 PM  Result Value Ref Range   Opiates POSITIVE (A) NONE DETECTED   Cocaine NONE DETECTED NONE DETECTED   Benzodiazepines NONE DETECTED NONE DETECTED   Amphetamines NONE DETECTED NONE DETECTED   Tetrahydrocannabinol NONE DETECTED NONE DETECTED   Barbiturates NONE DETECTED NONE DETECTED    Comment:        DRUG SCREEN FOR MEDICAL PURPOSES ONLY.  IF CONFIRMATION IS NEEDED FOR ANY PURPOSE, NOTIFY LAB WITHIN 5 DAYS.        LOWEST DETECTABLE LIMITS FOR URINE DRUG SCREEN Drug Class       Cutoff (ng/mL) Amphetamine      1000 Barbiturate      200 Benzodiazepine   323 Tricyclics       557 Opiates          300 Cocaine          300 THC              50   Acetaminophen level     Status: Abnormal   Collection Time: 03/02/16  8:06 PM  Result Value Ref Range   Acetaminophen (Tylenol), Serum <10 (L) 10 - 30 ug/mL    Comment:        THERAPEUTIC CONCENTRATIONS VARY SIGNIFICANTLY. A RANGE OF 10-30 ug/mL MAY BE AN EFFECTIVE CONCENTRATION FOR MANY PATIENTS. HOWEVER, SOME ARE BEST TREATED AT CONCENTRATIONS OUTSIDE THIS RANGE. ACETAMINOPHEN CONCENTRATIONS >150 ug/mL AT 4 HOURS AFTER INGESTION AND >50 ug/mL AT 12 HOURS AFTER INGESTION ARE OFTEN ASSOCIATED WITH TOXIC REACTIONS.   Comprehensive metabolic panel     Status: Abnormal   Collection Time: 03/03/16  5:03 AM  Result Value Ref Range   Sodium 138 135 - 145 mmol/L   Potassium 4.0 3.5 - 5.1 mmol/L   Chloride 111 101 - 111 mmol/L   CO2 24 22 - 32 mmol/L   Glucose, Bld 94 65 - 99 mg/dL   BUN 9 6 - 20 mg/dL   Creatinine, Ser 0.50 0.44 - 1.00 mg/dL   Calcium 8.3 (L) 8.9 - 10.3 mg/dL   Total Protein 5.8 (L) 6.5 - 8.1 g/dL   Albumin 3.4 (L) 3.5 - 5.0 g/dL   AST 410 (H) 15 - 41 U/L   ALT 665 (H) 14 - 54 U/L   Alkaline Phosphatase 120 38 - 126 U/L   Total Bilirubin 1.6 (H) 0.3 - 1.2 mg/dL   GFR calc non Af Amer >60 >60 mL/min   GFR calc Af Amer >60 >60 mL/min    Comment: (NOTE) The eGFR has been  calculated using the CKD EPI equation. This calculation has not been validated in all clinical situations. eGFR's persistently <60 mL/min signify possible Chronic Kidney Disease.    Anion gap 3 (L) 5 - 15  CBC     Status: Abnormal   Collection Time: 03/03/16  5:03 AM  Result Value Ref Range   WBC 4.0 4.0 - 10.5 K/uL   RBC 3.89 3.87 - 5.11 MIL/uL   Hemoglobin 11.8 (L) 12.0 - 15.0 g/dL   HCT 35.0 (L) 36.0 - 46.0 %   MCV 90.0 78.0 - 100.0 fL  MCH 30.3 26.0 - 34.0 pg   MCHC 33.7 30.0 - 36.0 g/dL   RDW 13.4 11.5 - 15.5 %   Platelets 184 150 - 400 K/uL    Ct Renal Stone Study  Result Date: 03/02/2016 CLINICAL DATA:  Right flank pain. EXAM: CT ABDOMEN AND PELVIS WITHOUT CONTRAST TECHNIQUE: Multidetector CT imaging of the abdomen and pelvis was performed following the standard protocol without IV contrast. COMPARISON:  Abdominal ultrasound 02/18/2016 FINDINGS: Lower chest: Trace bilateral pleural fluid. No visualized pneumonia. Hepatobiliary: Negative liver. Cholelithiasis with calcified stone seen near the neck. Gallbladder is full but there is no convincing pericholecystic inflammation. Pancreas: Normal Spleen: Normal Adrenals/Urinary Tract: Negative adrenal glands. No hydronephrosis or renal calculus. Negative urinary bladder. Stomach/Bowel: No obstruction or inflammation.  Normal appendix. Vascular/Lymphatic: Negative Reproductive: Negative Other: The lower peritoneum may be mildly thickened, suggesting previous inflammation. No ascites or pneumoperitoneum. Musculoskeletal: Negative IMPRESSION: 1. Cholelithiasis without evidence of cholecystitis. 2. Trace bilateral pleural fluid. Electronically Signed   By: Monte Fantasia M.D.   On: 03/02/2016 17:18   US Abdomen Limited Ruq  Result Date: 03/02/2016 CLINICAL DATA:  Subacute onset of right upper quadrant abdominal pain. Initial encounter. EXAM: US ABDOMEN LIMITED - RIGHT UPPER QUADRANT COMPARISON:  CT of the abdomen and pelvis performed  earlier today at 5:00 p.m. FINDINGS: Gallbladder: Multiple stones are noted within the gallbladder, measuring up to 9 mm in size. No gallbladder wall thickening or pericholecystic fluid is seen. Mild sludge is noted within the gallbladder. No ultrasonographic Murphy's sign is elicited. Common bile duct: Diameter: 0.3 cm, within normal limits in caliber. Liver: No focal lesion identified. Within normal limits in parenchymal echogenicity. The left hepatic lobe is not well characterized due to overlying bowel gas. IMPRESSION: No evidence of cholecystitis or obstruction. Cholelithiasis and mild sludge within the gallbladder. Electronically Signed   By: Garald Balding M.D.   On: 03/02/2016 19:32    Review of Systems  Constitutional: Negative for weight loss.  HENT: Negative for nosebleeds.   Eyes: Negative for blurred vision.  Respiratory: Negative for shortness of breath.   Cardiovascular: Negative for chest pain, palpitations, orthopnea and PND.       Denies DOE  Gastrointestinal: Positive for abdominal pain, nausea and vomiting. Negative for blood in stool and melena.  Genitourinary: Negative for dysuria and hematuria.  Musculoskeletal: Negative.   Skin: Negative for itching and rash.  Neurological: Negative for dizziness, focal weakness, seizures, loss of consciousness and headaches.  Endo/Heme/Allergies: Does not bruise/bleed easily.  Psychiatric/Behavioral: The patient is not nervous/anxious.    Blood pressure 109/69, pulse 61, temperature 98.1 F (36.7 C), temperature source Oral, resp. rate 16, last menstrual period 02/28/2016, SpO2 99 %, unknown if currently breastfeeding. Physical Exam  Vitals reviewed. Constitutional: She is oriented to person, place, and time. She appears well-developed and well-nourished. No distress.  obese  HENT:  Head: Normocephalic and atraumatic.  Right Ear: External ear normal.  Left Ear: External ear normal.  Eyes: Conjunctivae are normal. No scleral  icterus.  Neck: Normal range of motion. Neck supple. No tracheal deviation present. No thyromegaly present.  Cardiovascular: Normal rate and normal heart sounds.   Respiratory: Effort normal and breath sounds normal. No stridor. No respiratory distress. She has no wheezes.  GI: Soft. She exhibits no distension. There is tenderness in the right upper quadrant and epigastric area. There is no rigidity, no rebound and no guarding.    Musculoskeletal: She exhibits no edema or tenderness.  Lymphadenopathy:  She has no cervical adenopathy.  Neurological: She is alert and oriented to person, place, and time. She exhibits normal muscle tone.  Skin: Skin is warm and dry. No rash noted. She is not diaphoretic. No erythema. No pallor.  Psychiatric: She has a normal mood and affect. Her behavior is normal. Judgment and thought content normal.    Assessment/Plan: Symptomatic cholelithiasis  Possible early cholecystitis Elevated transaminases  I believe the patient's symptoms are consistent with gallbladder disease. I believe she probably passed a gallstone. Right now there is no overt evidence of common bile duct stone. We did talk about the possibility of needing additional procedures after cholecystectomy should her intraoperative cholangiogram be positive  We discussed gallbladder disease. We discussed non-operative and operative management. We discussed the signs & symptoms of acute cholecystitis  I discussed laparoscopic cholecystectomy with IOC in detail.  The patient was shown diagrams detailing the procedure.  We discussed the risks and benefits of a laparoscopic cholecystectomy including, but not limited to bleeding, infection, injury to surrounding structures such as the intestine or liver, bile leak, retained gallstones, need to convert to an open procedure, prolonged diarrhea, blood clots such as  DVT, common bile duct injury, anesthesia risks, and possible need for additional procedures.   We discussed the typical post-operative recovery course. I explained that the likelihood of improvement of their symptoms is good.  We will plan for laparoscopic cholecystectomy with cholangiogram tomorrow morning. She can have clear liquids today and nothing by mouth after midnight. IV antibiotics on call to surgery. She can have a dose of chemical DVT prophylaxis today which I will order  Chelsey Kemp. Redmond Pulling, MD, FACS General, Bariatric, & Minimally Invasive Surgery [facility], [place]    [facility] M 03/03/2016, 2:25 PM

## 2016-03-03 NOTE — Progress Notes (Signed)
[facility] PROGRESS NOTE  Chelsey NevinBelkis Kemp ZOX:[id number] DOB: [birth year] DOA: 03/02/2016 PCP: No PCP Per Patient  Interim summary and HPI 25 y.o. female with no significant past medical history who presents with 3 weeks right upper quadrant abdominal pain This was initially moderate in intensity, and intermittent. She went to the emergency room where a right upper quadrant ultrasound was negative, LFTs were normal and she was discharged with sucralfate and antacid. The pain persisted despite these treatments, and on the last 3 days, the pain has become intense, constant, worse with lying down, worse after eating, and associated with vomiting. She could no longer bear the pain so she came back to the ER.  Assessment/Plan: 1-cholelithiasis and biliary colic -patient w/o icterus -still with ongoing RUQ pain -no fever -CCS consulted and with plans to performed laparoscopic cholecystectomy with IOC on 11/25 -will follow rec's; NPO after midnight -continue PRN pain meds and supportive care  2-transaminitis -significantly elevated AST/ALT -most likely passive congestion from cholelithiasis and passing a gallstone  -LFT's improving/trending down -hepatitis panel pending (but giving presentation less likely) -EBV screen neg -HIV neg [facility] -will follow further rec's and if needed ERCP  3-N/V -most likely associated with #1 -will use PRN antiemetics  Code Status: Full Family Communication: friend at bedside  Disposition Plan: home when medically stable; laparoscopic surgery planned for 11/25, will follow up GI and general surgery rec's.   Consultants:  General Surgery  GI  Procedures:  See below for x-ray reports  Laparoscopic cholecystectomy with IOC (planned for 11/25)  Antibiotics:  None   HPI/Subjective: Afebrile, no CP and no SOB. Complaining of RUQ pain  Objective: Vitals:   03/03/16 1415 03/03/16 2045  BP:  109/69 116/68  Pulse: 61 67  Resp: 16 16  Temp: 98.1 F (36.7 C) 98.2 F (36.8 C)    Intake/Output Summary (Last 24 hours) at 03/03/16 2336 Last data filed at 03/03/16 2200  Gross per 24 hour  Intake          3087.08 ml  Output              350 ml  Net          2737.08 ml   There were no vitals filed for this visit.  Exam:   General:  Afebrile, complaining of RUQ pain, no CP, no SOB; denies nausea and vomiting. Endorses poor appetite  Cardiovascular: S1 and S2, no rubs, no gallops  Respiratory: CTA bilaterally  Abdomen: soft, mild guarding, tender to palpation on RUQ area and mid epigastric region positive BS  Musculoskeletal: no edema, no cyanosis   Data Reviewed: Basic Metabolic Panel:  Recent Labs Lab 03/02/16 1504 03/03/16 0503  NA 139 138  K 4.1 4.0  CL 107 111  CO2 27 24  GLUCOSE 115* 94  BUN 13 9  CREATININE 0.71 0.50  CALCIUM 9.5 8.3*   Liver Function Tests:  Recent Labs Lab 03/02/16 1504 03/03/16 0503  AST 1,372* 410*  ALT 880* 665*  ALKPHOS 119 120  BILITOT 1.1 1.6*  PROT 7.7 5.8*  ALBUMIN 4.9 3.4*    Recent Labs Lab 03/02/16 1900  LIPASE 23   CBC:  Recent Labs Lab 03/02/16 1504 03/03/16 0503  WBC 5.0 4.0  NEUTROABS 3.6  --   HGB 13.6 11.8*  HCT 40.1 35.0*  MCV 89.3 90.0  PLT 221 184    Studies: Ct Renal Stone Study  Result Date: 03/02/2016 CLINICAL DATA:  Right  flank pain. EXAM: CT ABDOMEN AND PELVIS WITHOUT CONTRAST TECHNIQUE: Multidetector CT imaging of the abdomen and pelvis was performed following the standard protocol without IV contrast. COMPARISON:  Abdominal ultrasound 02/18/2016 FINDINGS: Lower chest: Trace bilateral pleural fluid. No visualized pneumonia. Hepatobiliary: Negative liver. Cholelithiasis with calcified stone seen near the neck. Gallbladder is full but there is no convincing pericholecystic inflammation. Pancreas: Normal Spleen: Normal Adrenals/Urinary Tract: Negative adrenal glands. No hydronephrosis  or renal calculus. Negative urinary bladder. Stomach/Bowel: No obstruction or inflammation.  Normal appendix. Vascular/Lymphatic: Negative Reproductive: Negative Other: The lower peritoneum may be mildly thickened, suggesting previous inflammation. No ascites or pneumoperitoneum. Musculoskeletal: Negative IMPRESSION: 1. Cholelithiasis without evidence of cholecystitis. 2. Trace bilateral pleural fluid. Electronically Signed   By: Marnee SpringJonathon  Watts M.D.   On: 03/02/2016 17:18   [place] Abdomen Limited Ruq  Result Date: 03/02/2016 CLINICAL DATA:  Subacute onset of right upper quadrant abdominal pain. Initial encounter. EXAM: US ABDOMEN LIMITED - RIGHT UPPER QUADRANT COMPARISON:  CT of the abdomen and pelvis performed earlier today at 5:00 p.m. FINDINGS: Gallbladder: Multiple stones are noted within the gallbladder, measuring up to 9 mm in size. No gallbladder wall thickening or pericholecystic fluid is seen. Mild sludge is noted within the gallbladder. No ultrasonographic Murphy's sign is elicited. Common bile duct: Diameter: 0.3 cm, within normal limits in caliber. Liver: No focal lesion identified. Within normal limits in parenchymal echogenicity. The left hepatic lobe is not well characterized due to overlying bowel gas. IMPRESSION: No evidence of cholecystitis or obstruction. Cholelithiasis and mild sludge within the gallbladder. Electronically Signed   By: Roanna RaiderJeffery  Chang M.D.   On: 03/02/2016 19:32    Scheduled Meds: . [START ON 03/04/2016] cefoTEtan (CEFOTAN) IV  2 g Intravenous On Call to OR  . enoxaparin (LOVENOX) injection  40 mg Subcutaneous Q24H  . [START ON 03/04/2016] Influenza vac split quadrivalent PF  0.5 mL Intramuscular Tomorrow-1000   Continuous Infusions: . sodium chloride 125 mL/hr at 03/03/16 2300    Principal Problem:   Hepatitis Active Problems:   Calculus of gallbladder without cholecystitis without obstruction   RUQ pain   Abnormal CT of the abdomen    Time spent: 25  minutes    Vassie LollMadera, Johnryan Sao  [facility] Pager [phone]. If 7PM-7AM, please contact night-coverage at [email or web address], password [email or web address] 03/03/2016, 11:36 PM  LOS: 0 days

## 2016-03-03 NOTE — Consult Note (Signed)
Referring Provider: [facility] Primary Care Physician:  No PCP Per Patient Primary Gastroenterologist:   Gentry FitzUnassigned.  Reason for Consultation:  Elevated LFTs / Abdominal pain  HPI: Chelsey Kemp is a 25 y.o. Hispanic, non - English speaking female who presented to ED on 11/10 with upper abdominal pain. Workup was unremarkable and she was discharged with recommendations to see GI. Patient represented to ED yesterday with worsening pain, LFT were markedly elevated. RUQ confirmed cholelithiasis but no bile duct dilation.   History obtained using [employer] Marius. Her pain has been constant in 3-4 weeks. It is located in RUQ, frequently radiates through to her back and worse with meals. She has had associated nausea and vomiting. No fevers / chills. Patient doesn't drink ETOH, takes Tylenol a couple of times a month for menstrual pain. No herbs. No rx meds. No known liver disease. No FMH or liver disease. Patient [place] GI in [place] country 4 [place] for treatment of gastritis.   Past Medical History:  Diagnosis Date  . Gastritis   . Pre-eclampsia     Past Surgical History:  Procedure Laterality Date  . CESAREAN SECTION      Prior to Admission medications   Medication Sig Start Date End Date Taking? Authorizing Provider  famotidine (PEPCID) 20 MG tablet Take 1 tablet (20 mg total) by mouth 2 (two) times daily. 02/18/16  Yes Lorre NickAnthony Allen, MD    Current Facility-Administered Medications  Medication Dose Route Frequency Provider Last Rate Last Dose  . 0.9 %  sodium chloride infusion   Intravenous Continuous Alberteen Samhristopher P Danford, MD 125 mL/hr at 03/03/16 0556 1,000 mL at 03/03/16 0556  . enoxaparin (LOVENOX) injection 40 mg  40 mg Subcutaneous Q24H Alberteen Samhristopher P Danford, MD   40 mg at 03/02/16 2148  . HYDROmorphone (DILAUDID) injection 1 mg  1 mg Intravenous Q4H PRN Alberteen Samhristopher P Danford, MD      . ibuprofen (ADVIL,MOTRIN) tablet 400 mg  400 mg Oral Q6H PRN Alberteen Samhristopher P  Danford, MD   400 mg at 03/02/16 2148  . [START ON 03/04/2016] Influenza vac split quadrivalent PF (FLUARIX) injection 0.5 mL  0.5 mL Intramuscular Tomorrow-1000 Rhetta MuraJai-Gurmukh Samtani, MD      . ondansetron (ZOFRAN) tablet 4-8 mg  4-8 mg Oral Q8H PRN Alberteen Samhristopher P Danford, MD      . promethazine (PHENERGAN) tablet 25 mg  25 mg Oral Q6H PRN Alberteen Samhristopher P Danford, MD       Or  . promethazine (PHENERGAN) injection 12.5 mg  12.5 mg Intravenous Q6H PRN Alberteen Samhristopher P Danford, MD   12.5 mg at 03/02/16 2148   Or  . promethazine (PHENERGAN) suppository 25 mg  25 mg Rectal Q6H PRN Alberteen Samhristopher P Danford, MD        Allergies as of 03/02/2016  . (No Known Allergies)   FMH: No known gallbladder or liver diseases   Social History   Social History  . Marital status: Single    Spouse name: N/A  . Number of children: N/A  . Years of education: N/A   Occupational History  . Not on file.   Social History Main Topics  . Smoking status: Never Smoker  . Smokeless tobacco: Never Used  . Alcohol use No  . Drug use: Unknown  . Sexual activity: Not on file   Other Topics Concern  . Not on file   Social History Narrative  . No narrative on file    Review of Systems: All systems reviewed and negative  except where noted in HPI.  Physical Exam: Vital signs in last 24 hours: Temp:  [97.8 F (36.6 C)-98.6 F (37 C)] 98.6 F (37 C) (11/24 0442) Pulse Rate:  [61-78] 61 (11/23 2127) Resp:  [15-20] 15 (11/24 0442) BP: (96-127)/(52-78) 96/52 (11/24 0442) SpO2:  [96 %-100 %] 100 % (11/24 0442) Last BM Date: 02/28/16 General:   Well-developed Hispanic female in NAD Head:  Normocephalic and atraumatic. Eyes:  Sclera clear, no icterus.   Conjunctiva pink. Ears:  Normal auditory acuity. Nose:  No deformity, discharge,  or lesions. Mouth:  No deformity or lesions.   Neck:  Supple; no masses  Lungs:  Clear throughout to auscultation.   No wheezes, crackles, or rhonchi.  Heart:  Regular rate and  rhythm; no murmurs, clicks, rubs,  or gallops. Abdomen:  Soft,mild RUQ tenderness.  BS active, nonpalp mass or hsm.   Rectal:  Deferred  Msk:  Symmetrical without gross deformities. . Pulses:  Normal pulses noted. Extremities:  Without clubbing or edema. Neurologic:  Alert and  oriented x4;  grossly normal neurologically. Skin:  Intact without significant lesions or rashes.. Psych:  Alert and cooperative. Normal mood and affect.  Intake/Output from previous day: 11/23 0701 - 11/24 0700 In: 1147.1 [P.O.:120; I.V.:1027.1] Out: 0  Intake/Output this shift: No intake/output data recorded.  Lab Results:  Recent Labs  03/02/16 1504 03/03/16 0503  WBC 5.0 4.0  HGB 13.6 11.8*  HCT 40.1 35.0*  PLT 221 184   BMET  Recent Labs  03/02/16 1504 03/03/16 0503  NA 139 138  K 4.1 4.0  CL 107 111  CO2 27 24  GLUCOSE 115* 94  BUN 13 9  CREATININE 0.71 0.50  CALCIUM 9.5 8.3*   LFT  Recent Labs  03/03/16 0503  PROT 5.8*  ALBUMIN 3.4*  AST 410*  ALT 665*  ALKPHOS 120  BILITOT 1.6*   PT/INR  Recent Labs  03/02/16 1900  LABPROT 13.2  INR 1.00    Studies/Results: Ct Renal Stone Study  Result Date: 03/02/2016 CLINICAL DATA:  Right flank pain. EXAM: CT ABDOMEN AND PELVIS WITHOUT CONTRAST TECHNIQUE: Multidetector CT imaging of the abdomen and pelvis was performed following the standard protocol without IV contrast. COMPARISON:  Abdominal ultrasound 02/18/2016 FINDINGS: Lower chest: Trace bilateral pleural fluid. No visualized pneumonia. Hepatobiliary: Negative liver. Cholelithiasis with calcified stone seen near the neck. Gallbladder is full but there is no convincing pericholecystic inflammation. Pancreas: Normal Spleen: Normal Adrenals/Urinary Tract: Negative adrenal glands. No hydronephrosis or renal calculus. Negative urinary bladder. Stomach/Bowel: No obstruction or inflammation.  Normal appendix. Vascular/Lymphatic: Negative Reproductive: Negative Other: The lower  peritoneum may be mildly thickened, suggesting previous inflammation. No ascites or pneumoperitoneum. Musculoskeletal: Negative IMPRESSION: 1. Cholelithiasis without evidence of cholecystitis. 2. Trace bilateral pleural fluid. Electronically Signed   By: Marnee SpringJonathon  Watts M.D.   On: 03/02/2016 17:18   [place] Abdomen Limited Ruq  Result Date: 03/02/2016 CLINICAL DATA:  Subacute onset of right upper quadrant abdominal pain. Initial encounter. EXAM: US ABDOMEN LIMITED - RIGHT UPPER QUADRANT COMPARISON:  CT of the abdomen and pelvis performed earlier today at 5:00 p.m. FINDINGS: Gallbladder: Multiple stones are noted within the gallbladder, measuring up to 9 mm in size. No gallbladder wall thickening or pericholecystic fluid is seen. Mild sludge is noted within the gallbladder. No ultrasonographic Murphy's sign is elicited. Common bile duct: Diameter: 0.3 cm, within normal limits in caliber. Liver: No focal lesion identified. Within normal limits in parenchymal echogenicity. The left hepatic lobe is  not well characterized due to overlying bowel gas. IMPRESSION: No evidence of cholecystitis or obstruction. Cholelithiasis and mild sludge within the gallbladder. Electronically Signed   By: Roanna Raider M.D.   On: 03/02/2016 19:32    IMPRESSION / PLAN:   25. 25 year old Hispanic female with RUQ pain radiating through to back, worse with meals, nausea / vomiting and marked transaminitis. Transaminiases significantly improved overnight but now Tbili slightly elevated 1.1-----> 1.6. Cholelithiasis without choledocholithiasis on ultrasound. Normal lipase. Suspect she passed gallstone. I used Spanish interpreter to explain possibility of MRCP, ERCP and probable cholecystectomy. Patient understands potential courses of action.  -Recommend Surgical consult for consideration of cholecystectomy with cholangiogram. We spoke with Gaynelle Adu, MD, he will see her today.   2. Hematuria, on menstrual cycle  Willette Cluster   03/03/2016, 10:13 AM  Pager number [phone]

## 2016-03-04 ENCOUNTER — Encounter (HOSPITAL_COMMUNITY): Payer: Self-pay | Admitting: Anesthesiology

## 2016-03-04 ENCOUNTER — Inpatient Hospital Stay (HOSPITAL_COMMUNITY): Payer: Self-pay | Admitting: Anesthesiology

## 2016-03-04 ENCOUNTER — Encounter (HOSPITAL_COMMUNITY)
Admission: EM | Disposition: A | Discharge status: Home / Self Care | Payer: Self-pay | Source: Home / Self Care | Attending: Internal Medicine

## 2016-03-04 ENCOUNTER — Inpatient Hospital Stay (HOSPITAL_COMMUNITY): Payer: Self-pay

## 2016-03-04 DIAGNOSIS — K2970 Gastritis, unspecified, without bleeding: Secondary | ICD-10-CM | POA: Diagnosis present

## 2016-03-04 DIAGNOSIS — R7989 Other specified abnormal findings of blood chemistry: Secondary | ICD-10-CM

## 2016-03-04 DIAGNOSIS — R112 Nausea with vomiting, unspecified: Secondary | ICD-10-CM

## 2016-03-04 DIAGNOSIS — K8051 Calculus of bile duct without cholangitis or cholecystitis with obstruction: Secondary | ICD-10-CM | POA: Diagnosis present

## 2016-03-04 DIAGNOSIS — R945 Abnormal results of liver function studies: Secondary | ICD-10-CM

## 2016-03-04 HISTORY — PX: LAPAROSCOPIC CHOLECYSTECTOMY W/ CHOLANGIOGRAPHY: SUR757

## 2016-03-04 HISTORY — PX: CHOLECYSTECTOMY: SHX55

## 2016-03-04 LAB — HEPATITIS PANEL, ACUTE
HCV Ab: <0.1 s/co ratio (ref 0.0–0.9)
HEP B S AG: NEGATIVE
Hep A IgM: NEGATIVE
Hep B C IgM: NEGATIVE

## 2016-03-04 LAB — URINE CULTURE

## 2016-03-04 LAB — SURGICAL PCR SCREEN
MRSA, PCR: NEGATIVE
STAPHYLOCOCCUS AUREUS: NEGATIVE

## 2016-03-04 SURGERY — LAPAROSCOPIC CHOLECYSTECTOMY WITH INTRAOPERATIVE CHOLANGIOGRAM
Anesthesia: General | Site: Abdomen

## 2016-03-04 SURGERY — ERCP, WITH INTERVENTION IF INDICATED
Anesthesia: Monitor Anesthesia Care

## 2016-03-04 MED ORDER — PROPOFOL 10 MG/ML IV BOLUS
INTRAVENOUS | Status: AC
  Filled 2016-03-04: qty 20

## 2016-03-04 MED ORDER — LACTATED RINGERS IV SOLN
INTRAVENOUS | Status: DC | PRN
  Administered 2016-03-04: 07:00:00 via INTRAVENOUS

## 2016-03-04 MED ORDER — BUPIVACAINE HCL 0.5 % IJ SOLN
INTRAMUSCULAR | Status: DC | PRN
  Administered 2016-03-04: 8 mL

## 2016-03-04 MED ORDER — SODIUM CHLORIDE 0.9 % IV SOLN
INTRAVENOUS | Status: DC | PRN
  Administered 2016-03-04: 37 mL

## 2016-03-04 MED ORDER — IOPAMIDOL (ISOVUE-300) INJECTION 61%
INTRAVENOUS | Status: AC
  Filled 2016-03-04: qty 50

## 2016-03-04 MED ORDER — GLUCAGON HCL RDNA (DIAGNOSTIC) 1 MG IJ SOLR
INTRAMUSCULAR | Status: AC
  Filled 2016-03-04: qty 1

## 2016-03-04 MED ORDER — FENTANYL CITRATE (PF) 100 MCG/2ML IJ SOLN
INTRAMUSCULAR | Status: DC | PRN
  Administered 2016-03-04 (×2): 50 ug via INTRAVENOUS
  Administered 2016-03-04: 100 ug via INTRAVENOUS
  Administered 2016-03-04: 50 ug via INTRAVENOUS

## 2016-03-04 MED ORDER — DEXAMETHASONE SODIUM PHOSPHATE 10 MG/ML IJ SOLN
INTRAMUSCULAR | Status: DC | PRN
  Administered 2016-03-04: 10 mg via INTRAVENOUS

## 2016-03-04 MED ORDER — SUGAMMADEX SODIUM 200 MG/2ML IV SOLN
INTRAVENOUS | Status: DC | PRN
  Administered 2016-03-04: 150 mg via INTRAVENOUS

## 2016-03-04 MED ORDER — ROCURONIUM BROMIDE 50 MG/5ML IV SOSY
PREFILLED_SYRINGE | INTRAVENOUS | Status: AC
  Filled 2016-03-04: qty 5

## 2016-03-04 MED ORDER — ROCURONIUM BROMIDE 10 MG/ML (PF) SYRINGE
PREFILLED_SYRINGE | INTRAVENOUS | Status: DC | PRN
  Administered 2016-03-04: 10 mg via INTRAVENOUS
  Administered 2016-03-04: 30 mg via INTRAVENOUS
  Administered 2016-03-04: 10 mg via INTRAVENOUS

## 2016-03-04 MED ORDER — KETOROLAC TROMETHAMINE 30 MG/ML IJ SOLN
INTRAMUSCULAR | Status: DC | PRN
  Administered 2016-03-04: 30 mg via INTRAVENOUS

## 2016-03-04 MED ORDER — HYDROMORPHONE HCL 1 MG/ML IJ SOLN
0.2500 mg | INTRAMUSCULAR | Status: DC | PRN
  Administered 2016-03-04: 0.5 mg via INTRAVENOUS

## 2016-03-04 MED ORDER — CEFOTETAN DISODIUM-DEXTROSE 2-2.08 GM-% IV SOLR
INTRAVENOUS | Status: AC
  Filled 2016-03-04: qty 50

## 2016-03-04 MED ORDER — KETOROLAC TROMETHAMINE 30 MG/ML IJ SOLN
INTRAMUSCULAR | Status: AC
  Filled 2016-03-04: qty 1

## 2016-03-04 MED ORDER — LIDOCAINE 2% (20 MG/ML) 5 ML SYRINGE
INTRAMUSCULAR | Status: AC
  Filled 2016-03-04: qty 5

## 2016-03-04 MED ORDER — SODIUM CHLORIDE 0.9 % IV SOLN
INTRAVENOUS | Status: DC
  Administered 2016-03-04 – 2016-03-06 (×3): via INTRAVENOUS

## 2016-03-04 MED ORDER — GLUCAGON HCL RDNA (DIAGNOSTIC) 1 MG IJ SOLR
INTRAMUSCULAR | Status: DC | PRN
  Administered 2016-03-04: 1 mg via INTRAVENOUS

## 2016-03-04 MED ORDER — LACTATED RINGERS IR SOLN
Status: DC | PRN
  Administered 2016-03-04: 3000 mL

## 2016-03-04 MED ORDER — HYDROMORPHONE HCL 1 MG/ML IJ SOLN
INTRAMUSCULAR | Status: AC
  Filled 2016-03-04: qty 1

## 2016-03-04 MED ORDER — FENTANYL CITRATE (PF) 250 MCG/5ML IJ SOLN
INTRAMUSCULAR | Status: AC
  Filled 2016-03-04: qty 5

## 2016-03-04 MED ORDER — HYDROMORPHONE HCL 1 MG/ML IJ SOLN
0.5000 mg | INTRAMUSCULAR | Status: DC | PRN
  Administered 2016-03-04 (×4): 1 mg via INTRAVENOUS
  Administered 2016-03-04: 0.5 mg via INTRAVENOUS
  Administered 2016-03-05 – 2016-03-07 (×12): 1 mg via INTRAVENOUS
  Filled 2016-03-04 (×17): qty 1

## 2016-03-04 MED ORDER — PROMETHAZINE HCL 25 MG/ML IJ SOLN: 6.2500 mg | INTRAMUSCULAR | Status: DC | PRN

## 2016-03-04 MED ORDER — ONDANSETRON HCL 4 MG/2ML IJ SOLN
INTRAMUSCULAR | Status: DC | PRN
  Administered 2016-03-04: 4 mg via INTRAVENOUS

## 2016-03-04 MED ORDER — SUCCINYLCHOLINE CHLORIDE 200 MG/10ML IV SOSY
PREFILLED_SYRINGE | INTRAVENOUS | Status: DC | PRN
  Administered 2016-03-04: 100 mg via INTRAVENOUS

## 2016-03-04 MED ORDER — LIDOCAINE 2% (20 MG/ML) 5 ML SYRINGE
INTRAMUSCULAR | Status: DC | PRN
  Administered 2016-03-04: 100 mg via INTRAVENOUS

## 2016-03-04 MED ORDER — 0.9 % SODIUM CHLORIDE (POUR BTL) OPTIME
TOPICAL | Status: DC | PRN
  Administered 2016-03-04: 1000 mL

## 2016-03-04 MED ORDER — BUPIVACAINE HCL (PF) 0.5 % IJ SOLN
INTRAMUSCULAR | Status: AC
  Filled 2016-03-04: qty 30

## 2016-03-04 MED ORDER — DEXAMETHASONE SODIUM PHOSPHATE 10 MG/ML IJ SOLN
INTRAMUSCULAR | Status: AC
  Filled 2016-03-04: qty 1

## 2016-03-04 MED ORDER — DEXTROSE 5 % IV SOLN
1.0000 g | INTRAVENOUS | Status: DC
  Administered 2016-03-04 – 2016-03-06 (×3): 1 g via INTRAVENOUS
  Filled 2016-03-04 (×3): qty 10

## 2016-03-04 MED ORDER — PROPOFOL 10 MG/ML IV BOLUS
INTRAVENOUS | Status: DC | PRN
  Administered 2016-03-04: 120 mg via INTRAVENOUS

## 2016-03-04 MED ORDER — SUCCINYLCHOLINE CHLORIDE 200 MG/10ML IV SOSY
PREFILLED_SYRINGE | INTRAVENOUS | Status: AC
  Filled 2016-03-04: qty 10

## 2016-03-04 MED ORDER — MIDAZOLAM HCL 2 MG/2ML IJ SOLN
INTRAMUSCULAR | Status: DC | PRN
  Administered 2016-03-04: 2 mg via INTRAVENOUS

## 2016-03-04 MED ORDER — MIDAZOLAM HCL 2 MG/2ML IJ SOLN
INTRAMUSCULAR | Status: AC
  Filled 2016-03-04: qty 2

## 2016-03-04 MED ORDER — ACETAMINOPHEN 325 MG PO TABS
650.0000 mg | ORAL_TABLET | Freq: Four times a day (QID) | ORAL | Status: DC | PRN
  Administered 2016-03-07 – 2016-03-08 (×2): 650 mg via ORAL
  Filled 2016-03-04 (×2): qty 2

## 2016-03-04 MED ORDER — HEPARIN SODIUM (PORCINE) 5000 UNIT/ML IJ SOLN
5000.0000 [IU] | Freq: Three times a day (TID) | INTRAMUSCULAR | Status: DC
  Administered 2016-03-04 – 2016-03-05 (×2): 5000 [IU] via SUBCUTANEOUS
  Filled 2016-03-04 (×2): qty 1

## 2016-03-04 MED ORDER — ONDANSETRON HCL 4 MG/2ML IJ SOLN
INTRAMUSCULAR | Status: AC
Start: 2016-03-04 — End: 2016-03-04
  Filled 2016-03-04: qty 2

## 2016-03-04 MED ORDER — SODIUM CHLORIDE 0.9 % IJ SOLN
INTRAMUSCULAR | Status: AC
  Filled 2016-03-04: qty 10

## 2016-03-04 MED ORDER — SUGAMMADEX SODIUM 200 MG/2ML IV SOLN
INTRAVENOUS | Status: AC
  Filled 2016-03-04: qty 2

## 2016-03-04 SURGICAL SUPPLY — 46 items
APPLICATOR ARISTA FLEXITIP XL (MISCELLANEOUS) IMPLANT
APPLIER CLIP 5 13 M/L LIGAMAX5 (MISCELLANEOUS) ×3
APPLIER CLIP ROT 10 11.4 M/L (STAPLE) ×3
BANDAGE ADH SHEER 1  50/CT (GAUZE/BANDAGES/DRESSINGS) ×12 IMPLANT
BENZOIN TINCTURE PRP APPL 2/3 (GAUZE/BANDAGES/DRESSINGS) ×3 IMPLANT
CABLE HIGH FREQUENCY MONO STRZ (ELECTRODE) ×3 IMPLANT
CHLORAPREP W/TINT 26ML (MISCELLANEOUS) ×3 IMPLANT
CLIP APPLIE 5 13 M/L LIGAMAX5 (MISCELLANEOUS) ×1 IMPLANT
CLIP APPLIE ROT 10 11.4 M/L (STAPLE) ×1 IMPLANT
CLOSURE STERI-STRIP 1/4X4 (GAUZE/BANDAGES/DRESSINGS) IMPLANT
COVER MAYO STAND STRL (DRAPES) ×3 IMPLANT
COVER SURGICAL LIGHT HANDLE (MISCELLANEOUS) ×3 IMPLANT
DECANTER SPIKE VIAL GLASS SM (MISCELLANEOUS) ×3 IMPLANT
DERMABOND ADVANCED (GAUZE/BANDAGES/DRESSINGS) ×2
DERMABOND ADVANCED .7 DNX12 (GAUZE/BANDAGES/DRESSINGS) ×1 IMPLANT
DRAPE C-ARM 42X120 X-RAY (DRAPES) IMPLANT
DRSG TEGADERM 2-3/8X2-3/4 SM (GAUZE/BANDAGES/DRESSINGS) ×3 IMPLANT
ELECT PENCIL ROCKER SW 15FT (MISCELLANEOUS) ×3 IMPLANT
ELECT REM PT RETURN 9FT ADLT (ELECTROSURGICAL) ×3
ELECTRODE REM PT RTRN 9FT ADLT (ELECTROSURGICAL) ×1 IMPLANT
GAUZE SPONGE 2X2 8PLY STRL LF (GAUZE/BANDAGES/DRESSINGS) ×1 IMPLANT
GLOVE BIO SURGEON STRL SZ7.5 (GLOVE) ×3 IMPLANT
GLOVE INDICATOR 8.0 STRL GRN (GLOVE) ×3 IMPLANT
GOWN STRL REUS W/TWL XL LVL3 (GOWN DISPOSABLE) ×9 IMPLANT
HEMOSTAT ARISTA ABSORB 3G PWDR (MISCELLANEOUS) IMPLANT
HEMOSTAT SNOW SURGICEL 2X4 (HEMOSTASIS) IMPLANT
IRRIG SUCT STRYKERFLOW 2 WTIP (MISCELLANEOUS) ×3
IRRIGATION SUCT STRKRFLW 2 WTP (MISCELLANEOUS) ×1 IMPLANT
KIT BASIN OR (CUSTOM PROCEDURE TRAY) ×3 IMPLANT
L-HOOK LAP DISP 36CM (ELECTROSURGICAL)
LHOOK LAP DISP 36CM (ELECTROSURGICAL) IMPLANT
POUCH RETRIEVAL ECOSAC 10 (ENDOMECHANICALS) ×1 IMPLANT
POUCH RETRIEVAL ECOSAC 10MM (ENDOMECHANICALS) ×2
SCISSORS LAP 5X35 DISP (ENDOMECHANICALS) ×3 IMPLANT
SET CHOLANGIOGRAPH MIX (MISCELLANEOUS) ×3 IMPLANT
SLEEVE XCEL OPT CAN 5 100 (ENDOMECHANICALS) ×6 IMPLANT
SPONGE GAUZE 2X2 STER 10/PKG (GAUZE/BANDAGES/DRESSINGS) ×2
SUT MNCRL AB 4-0 PS2 18 (SUTURE) ×3 IMPLANT
SUT VICRYL 0 UR6 27IN ABS (SUTURE) IMPLANT
TOWEL OR 17X26 10 PK STRL BLUE (TOWEL DISPOSABLE) ×3 IMPLANT
TOWEL OR NON WOVEN STRL DISP B (DISPOSABLE) ×3 IMPLANT
TRAY LAPAROSCOPIC (CUSTOM PROCEDURE TRAY) ×3 IMPLANT
TROCAR BLADELESS OPT 5 100 (ENDOMECHANICALS) ×3 IMPLANT
TROCAR XCEL BLUNT TIP 100MML (ENDOMECHANICALS) ×3 IMPLANT
TROCAR XCEL NON-BLD 11X100MML (ENDOMECHANICALS) IMPLANT
TUBING INSUF HEATED (TUBING) ×3 IMPLANT

## 2016-03-04 NOTE — Op Note (Signed)
Chelsey NevinBelkis Kemp [id number] 03-Mar-1991 03/04/2016  Laparoscopic Cholecystectomy with IOC Procedure Note  Indications: This patient presents with symptomatic gallbladder disease and will undergo laparoscopic cholecystectomy. Please see chart for additional details regarding the patient's history  Pre-operative Diagnosis: Symptomatic cholelithiasis  Post-operative Diagnosis: Calculus of gallbladder with other cholecystitis and obstruction; choledocholithiasis  Surgeon: Atilano InaWILSON,Ellieanna Funderburg M   Assistants: None  Anesthesia: General endotracheal anesthesia   Procedure Details  The patient was seen again in the Holding Room. The risks, benefits, complications, treatment options, and expected outcomes were discussed with the patient. The possibilities of reaction to medication, pulmonary aspiration, perforation of viscus, bleeding, recurrent infection, finding a normal gallbladder, the need for additional procedures, failure to diagnose a condition, the possible need to convert to an open procedure, and creating a complication requiring transfusion or operation were discussed with the patient. The likelihood of improving the patient's symptoms with return to their baseline status is good.  The patient and/or family concurred with the proposed plan, giving informed consent. The site of surgery properly noted. The patient was taken to Operating Room, identified as Chelsey NevinBelkis Dolata and the procedure verified as Laparoscopic Cholecystectomy with Intraoperative Cholangiogram. A Time Out was held and the above information confirmed. Antibiotic prophylaxis was administered.   Prior to the induction of general anesthesia, antibiotic prophylaxis was administered. General endotracheal anesthesia was then administered and tolerated well. After the induction, the abdomen was prepped with Chloraprep and draped in the sterile fashion. The patient was positioned in the supine position.  Local anesthetic agent was injected  into the skin near the umbilicus and an incision made. We dissected down to the abdominal fascia with blunt dissection.  The fascia was incised vertically and we entered the peritoneal cavity bluntly.  A pursestring suture of 0-Vicryl was placed around the fascial opening.  The Hasson cannula was inserted and secured with the stay suture.  Pneumoperitoneum was then created with CO2 and tolerated well without any adverse changes in the patient's vital signs. An 5-mm port was placed in the subxiphoid position.  Two 5-mm ports were placed in the right upper quadrant. All skin incisions were infiltrated with a local anesthetic agent before making the incision and placing the trocars.   We positioned the patient in reverse Trendelenburg, tilted slightly to the patient's left. The gallbladder was distended and looked chronically inflamed. The gallbladder was identified, the fundus grasped and retracted cephalad. Adhesions were lysed bluntly and with the electrocautery where indicated, taking care not to injure any adjacent organs or viscus. The infundibulum was grasped and retracted laterally, exposing the peritoneum overlying the triangle of Calot. This was then divided and exposed in a blunt fashion. A critical view of the cystic duct and cystic artery was obtained.  The cystic artery crossed anterior to the cystic duct. The cystic duct was clearly identified and bluntly dissected circumferentially. A large critical view was obtained. However since the artery crossed anterior to the duct I decided to go ahead and take down the cystic artery to make it easier to do the cholangiogram. 2 clips were placed on the proximal aspect of the cystic artery and one distally as it entered the gallbladder it was then transected with EndoShears. The cystic duct was ligated with a clip distally.   An incision was made in the cystic duct and the [facility] cholangiogram catheter introduced. The catheter was secured using a clip. A  cholangiogram was then obtained which showed good visualization of the proximal & mid biliary tree however there  was no passage of contrast in the distal common bile duct and no filling of the small bowel. 1 mg of glucagon was administered and a repeat cholangiogram was performed. Still there was no opacification of the small bowel. There appeared to be a filling defect in the distal common bile duct consistent with choledocholithiasis. The catheter was then removed.   The cystic duct was then ligated with clips and divided.   The gallbladder was dissected from the liver bed in retrograde fashion with the electrocautery. There was some spillage of bile from the gallbladder as it was being mobilized but no stone spillage. The gallbladder was removed and placed in an Ecco sac.  The gallbladder and Ecco sac were then removed through the umbilical port site. The liver bed was irrigated and inspected. Hemostasis was achieved with the electrocautery. Copious irrigation was utilized and was repeatedly aspirated until clear.  The pursestring suture was used to close the umbilical fascia.    We again inspected the right upper quadrant for hemostasis.  The umbilical closure was inspected and there was no air leak and nothing trapped within the closure. Pneumoperitoneum was released as we removed the trocars.  4-0 Monocryl was used to close the skin.   Dermabond was applied. The patient was then extubated and brought to the recovery room in stable condition. Instrument, sponge, and needle counts were correct at closure and at the conclusion of the case.   Findings: Chronic Cholecystitis with Cholelithiasis; distal common bile duct obstruction consistent with choledocholithiasis  Estimated Blood Loss: Minimal         Drains: none         Specimens: Gallbladder           Complications: None; patient tolerated the procedure well.         Disposition: PACU - hemodynamically stable.         Condition:  stable  Mary SellaEric M. Andrey CampanileWilson, MD, FACS General, Bariatric, & Minimally Invasive Surgery [facility], [place]

## 2016-03-04 NOTE — Anesthesia Procedure Notes (Signed)
Procedure Name: Intubation Date/Time: 03/04/2016 7:39 AM Performed by: Leroy LibmanEARDON, Chelsey Ridlon L Patient Re-evaluated:Patient Re-evaluated prior to inductionOxygen Delivery Method: Circle system utilized Preoxygenation: Pre-oxygenation with 100% oxygen Intubation Type: IV induction Ventilation: Mask ventilation without difficulty and Oral airway inserted - appropriate to patient size Laryngoscope Size: Hyacinth MeekerMiller and 2 Grade View: Grade I Tube type: Oral Tube size: 7.0 mm Number of attempts: 1 Airway Equipment and Method: Stylet Placement Confirmation: ETT inserted through vocal cords under direct vision,  positive ETCO2 and breath sounds checked- equal and bilateral Secured at: 20 cm Dental Injury: Teeth and Oropharynx as per pre-operative assessment

## 2016-03-04 NOTE — Anesthesia Postprocedure Evaluation (Signed)
Anesthesia Post Note  Patient: Shaylyn Oh  Procedure(s) Performed: Procedure(s) (LRB): LAPAROSCOPIC CHOLECYSTECTOMY WITH INTRAOPERATIVE CHOLANGIOGRAM (N/A)  Patient location during evaluation: PACU Anesthesia Type: General Level of consciousness: awake and alert Pain management: pain level controlled Vital Signs Assessment: post-procedure vital signs reviewed and stable Respiratory status: spontaneous breathing, nonlabored ventilation, respiratory function stable and patient connected to nasal cannula oxygen Cardiovascular status: blood pressure returned to baseline and stable Postop Assessment: no signs of nausea or vomiting Anesthetic complications: no    Last Vitals:  Vitals:   03/04/16 1000 03/04/16 1019  BP: 110/65 114/65  Pulse: 65 62  Resp: 14 14  Temp: 36.9 C 37.3 C    Last Pain:  Vitals:   03/04/16 1103  TempSrc:   PainSc: 6                  Kennieth RadFitzgerald, Kieran Nachtigal E

## 2016-03-04 NOTE — Transfer of Care (Signed)
Immediate Anesthesia Transfer of Care Note  Patient: Chelsey Kemp  Procedure(s) Performed: Procedure(s): LAPAROSCOPIC CHOLECYSTECTOMY WITH INTRAOPERATIVE CHOLANGIOGRAM (N/A)  Patient Location: PACU  Anesthesia Type:General  Level of Consciousness: sedated  Airway & Oxygen Therapy: Patient Spontanous Breathing and Patient connected to face mask oxygen  Post-op Assessment: Report given to RN and Post -op Vital signs reviewed and stable  Post vital signs: Reviewed and stable  Last Vitals:  Vitals:   03/03/16 2045 03/04/16 0500  BP: 116/68 (!) 107/59  Pulse: 67 (!) 59  Resp: 16 16  Temp: 36.8 C 37.1 C    Last Pain:  Vitals:   03/04/16 0500  TempSrc: Oral  PainSc:       Patients Stated Pain Goal: 2 (03/03/16 1933)  Complications: No apparent anesthesia complications

## 2016-03-04 NOTE — Interval H&P Note (Signed)
History and Physical Interval Note:  03/04/2016 7:21 AM  Chelsey Kemp  has presented today for surgery, with the diagnosis of cholelithiasis  The various methods of treatment have been discussed with the patient and family. After consideration of risks, benefits and other options for treatment, the patient has consented to  Procedure(s): LAPAROSCOPIC CHOLECYSTECTOMY WITH INTRAOPERATIVE CHOLANGIOGRAM (N/A) as a surgical intervention .  The patient's history has been reviewed, patient examined, no change in status, stable for surgery.  I have reviewed the patient's chart and labs.  Questions were answered to the patient's satisfaction.    Mary SellaEric M. Andrey CampanileWilson, MD, FACS General, Bariatric, & Minimally Invasive Surgery [facility], [place]    [facility] DistrictWILSON,Gjon Letarte M

## 2016-03-04 NOTE — Progress Notes (Signed)
Daily Rounding Note  03/04/2016, 11:01 AM  LOS: 1 day   SUBJECTIVE:   Chief complaint:     Had lap chole this AM.  On IOC contrast did not pass into distal CBD or into SB, ? filling defect in distal CBD?   OBJECTIVE:         Vital signs in last 24 hours:    Temp:  [97.9 F (36.6 C)-99.1 F (37.3 C)] 99.1 F (37.3 C) (11/25 1019) Pulse Rate:  [59-69] 62 (11/25 1019) Resp:  [10-18] 14 (11/25 1019) BP: (107-117)/(59-74) 114/65 (11/25 1019) SpO2:  [96 %-100 %] 100 % (11/25 1019) Weight:  [76.7 kg (169 lb)] 76.7 kg (169 lb) (11/25 0715) Last BM Date: 02/28/16 Filed Weights   03/04/16 0715  Weight: 76.7 kg (169 lb)   General: drowsy, looks a bit pale post surgery Heart: RRR Chest: clear bil.  No cough or labored breathing Abdomen: soft, BS scant, surgical incisions are glued and look good  Extremities: no CCe Neuro/Psych:  Drowsy but is oriented x 3.    Intake/Output from previous day: 11/24 0701 - 11/25 0700 In: 3060 [P.O.:60; I.V.:3000] Out: 350 [Urine:350]  Intake/Output this shift: Total I/O In: 850 [I.V.:800; IV Piggyback:50] Out: 25 [Blood:25]  Lab Results:  Recent Labs  03/02/16 1504 03/03/16 0503  WBC 5.0 4.0  HGB 13.6 11.8*  HCT 40.1 35.0*  PLT 221 184   BMET  Recent Labs  03/02/16 1504 03/03/16 0503  NA 139 138  K 4.1 4.0  CL 107 111  CO2 27 24  GLUCOSE 115* 94  BUN 13 9  CREATININE 0.71 0.50  CALCIUM 9.5 8.3*   LFT  Recent Labs  03/02/16 1504 03/03/16 0503  PROT 7.7 5.8*  ALBUMIN 4.9 3.4*  AST 1,372* 410*  ALT 880* 665*  ALKPHOS 119 120  BILITOT 1.1 1.6*   PT/INR  Recent Labs  03/02/16 1900  LABPROT 13.2  INR 1.00   Hepatitis Panel  Recent Labs  03/02/16 1900  HEPBSAG Negative  HCVAB <0.1  HEPAIGM Negative  HEPBIGM Negative    Studies/Results: Dg Cholangiogram Operative  Result Date: 03/04/2016 CLINICAL DATA:  25 year old female with a history of  cholecystectomy EXAM: INTRAOPERATIVE CHOLANGIOGRAM TECHNIQUE: Cholangiographic images from the C-arm fluoroscopic device were submitted for interpretation post-operatively. Please see the procedural report for the amount of contrast and the fluoroscopy time utilized. COMPARISON:  Ultrasound 03/02/2016, CT 03/02/2016 FINDINGS: Surgical instruments project over the upper abdomen. There is cannulation of the cystic duct/gallbladder neck, with antegrade infusion of contrast. Caliber of the extrahepatic ductal system within normal limits. Vague filling defects within the common bile duct. Contrast does not cross the ampulla into the duodenum. Extraluminal contrast at the site of cystic duct cannulation and at the inferior liver margin. IMPRESSION: Intraoperative cholangiogram demonstrates no contrast traversing the ampulla with vague filling defects within the common bile duct potentially representing retained debris/ stones. Please refer to the dictated operative report for full details of intraoperative findings and procedure Signed, Yvone NeuJaime S. Loreta AveWagner, DO [facility] Electronically Signed   By: Gilmer MorJaime  Wagner D.O.   On: 03/04/2016 09:20   Ct Renal Stone Study  Result Date: 03/02/2016 CLINICAL DATA:  Right flank pain. EXAM: CT ABDOMEN AND PELVIS WITHOUT CONTRAST TECHNIQUE: Multidetector CT imaging of the abdomen and pelvis was performed following the standard protocol without IV contrast. COMPARISON:  Abdominal ultrasound 02/18/2016 FINDINGS: Lower chest: Trace bilateral pleural  fluid. No visualized pneumonia. Hepatobiliary: Negative liver. Cholelithiasis with calcified stone seen near the neck. Gallbladder is full but there is no convincing pericholecystic inflammation. Pancreas: Normal Spleen: Normal Adrenals/Urinary Tract: Negative adrenal glands. No hydronephrosis or renal calculus. Negative urinary bladder. Stomach/Bowel: No obstruction or inflammation.   Normal appendix. Vascular/Lymphatic: Negative Reproductive: Negative Other: The lower peritoneum may be mildly thickened, suggesting previous inflammation. No ascites or pneumoperitoneum. Musculoskeletal: Negative IMPRESSION: 1. Cholelithiasis without evidence of cholecystitis. 2. Trace bilateral pleural fluid. Electronically Signed   By: Marnee SpringJonathon  Watts M.D.   On: 03/02/2016 17:18   [place] Abdomen Limited Ruq  Result Date: 03/02/2016 CLINICAL DATA:  Subacute onset of right upper quadrant abdominal pain. Initial encounter. EXAM: US ABDOMEN LIMITED - RIGHT UPPER QUADRANT COMPARISON:  CT of the abdomen and pelvis performed earlier today at 5:00 p.m. FINDINGS: Gallbladder: Multiple stones are noted within the gallbladder, measuring up to 9 mm in size. No gallbladder wall thickening or pericholecystic fluid is seen. Mild sludge is noted within the gallbladder. No ultrasonographic Murphy's sign is elicited. Common bile duct: Diameter: 0.3 cm, within normal limits in caliber. Liver: No focal lesion identified. Within normal limits in parenchymal echogenicity. The left hepatic lobe is not well characterized due to overlying bowel gas. IMPRESSION: No evidence of cholecystitis or obstruction. Cholelithiasis and mild sludge within the gallbladder. Electronically Signed   By: Roanna RaiderJeffery  Chang M.D.   On: 03/02/2016 19:32   Scheduled Meds: . heparin subcutaneous  5,000 Units Subcutaneous Q8H  . HYDROmorphone      . Influenza vac split quadrivalent PF  0.5 mL Intramuscular Tomorrow-1000   Continuous Infusions: . sodium chloride 75 mL/hr at 03/04/16 1031   PRN Meds:.acetaminophen, HYDROmorphone (DILAUDID) injection, ibuprofen, ondansetron, promethazine **OR** promethazine **OR** promethazine  ASSESMENT:   *  Cholelithiasis.  Lap chole with IOC this AM  *  Choledocholithiasis per IOC.  This was not apparent on preop ultrasound or CT scan.  Preop elevation of Transaminases had improved preop.    PLAN   *  Spoke  with pt's aunt, Lynita Lombardllida Gavara, via the I pad transalation service, since pt is quite drowsy post op she slept during the bulk of the interview. Risks of bleeding, infection, pancreatitis, stent placement if MD unable to remove stone, bile duct damage d/w aunt.  She is agreeable and can sign consent.  Also explained that case may go later today or tomorrow, in case it goes today pt being kept NPO  *  Discussed case with Dr Alferd PateeMadeira and will start Rocephin at 5 PM tonight, she received Cefotan at 0740 this morning and this is q 12 hour abx.     Jennye MoccasinSarah Ikeya Brockel  03/04/2016, 11:01 AM Pager: [phone]

## 2016-03-04 NOTE — Progress Notes (Signed)
[facility] PROGRESS NOTE  Angela NevinBelkis Gazzola VPX:[id number]:[id number] DOB: 07/02/1990 DOA: 03/02/2016 PCP: No PCP Per Patient  Interim summary and HPI 25 y.o. female with no significant past medical history who presents with 3 weeks right upper quadrant abdominal pain This was initially moderate in intensity, and intermittent. She went to the emergency room where a right upper quadrant ultrasound was negative, LFTs were normal and she was discharged with sucralfate and antacid. The pain persisted despite these treatments, and on the last 3 days, the pain has become intense, constant, worse with lying down, worse after eating, and associated with vomiting. She could no longer bear the pain so she came back to the ER.  Assessment/Plan: 1-cholelithiasis and biliary colic -patient w/o icterus -no fever and normal WBC's -[facility] has performed lap cholecystectomy today; well tolerated. Abnormal IOC and GI planning ERCP on 11/26 -will follow rec's; diet advancement per surgery; per GI will be ok to do clear liquids diet and NPO after midnight for ERCP in am -continue PRN pain meds and supportive care  2-transaminitis -significantly elevated AST/ALT on admission  -most likely passive congestion from cholelithiasis and passing/retained gallstone  -LFT's improving/trending down; will recheck CMET in am -hepatitis panel And EBV screen neg -HIV neg -abnormal results seen on IOC during laparoscopic cholecystectomy; after discussing with GI plans are for ERCP on 11/26 -empiric rocephin initiated  3-N/V -most likely associated with #1 -will use PRN antiemetics  Code Status: Full Family Communication: Aunt at bedside  Disposition Plan: home when medically stable; laparoscopic surgery performed today 11/25, will follow up GI rec's, filling defects and concerns for retain stone in distal aspect of cystic duct; plan is for ERCP tomorrow 11/26.   Consultants:  General Surgery  GI  Procedures:  See  below for x-ray reports  Laparoscopic cholecystectomy with IOC (11/25)  ERCP 11/26  Antibiotics:  None   HPI/Subjective: Afebrile, no CP and no SOB. Somnolent from surgery  Objective: Vitals:   03/04/16 1250 03/04/16 1330  BP: 115/66 (!) 99/52  Pulse: 82 67  Resp:  14  Temp: 99 F (37.2 C) 98.7 F (37.1 C)    Intake/Output Summary (Last 24 hours) at 03/04/16 1618 Last data filed at 03/04/16 1500  Gross per 24 hour  Intake          3608.75 ml  Output             1775 ml  Net          1833.75 ml   Filed Weights   03/04/16 0715  Weight: 76.7 kg (169 lb)    Exam:   General:  Afebrile, somnolent from surgery, but stable. denies nausea and vomiting.    Cardiovascular: S1 and S2, no rubs, no gallops  Respiratory: CTA bilaterally  Abdomen: soft, mild guarding, tender from surgery; positive BS  Musculoskeletal: no edema, no cyanosis   Data Reviewed: Basic Metabolic Panel:  Recent Labs Lab 03/02/16 1504 03/03/16 0503  NA 139 138  K 4.1 4.0  CL 107 111  CO2 27 24  GLUCOSE 115* 94  BUN 13 9  CREATININE 0.71 0.50  CALCIUM 9.5 8.3*   Liver Function Tests:  Recent Labs Lab 03/02/16 1504 03/03/16 0503  AST 1,372* 410*  ALT 880* 665*  ALKPHOS 119 120  BILITOT 1.1 1.6*  PROT 7.7 5.8*  ALBUMIN 4.9 3.4*    Recent Labs Lab 03/02/16 1900  LIPASE 23   CBC:  Recent Labs Lab 03/02/16 1504 03/03/16 0503  WBC 5.0  4.0  NEUTROABS 3.6  --   HGB 13.6 11.8*  HCT 40.1 35.0*  MCV 89.3 90.0  PLT 221 184    Studies: Dg Cholangiogram Operative  Result Date: 03/04/2016 CLINICAL DATA:  25 year old female with a history of cholecystectomy EXAM: INTRAOPERATIVE CHOLANGIOGRAM TECHNIQUE: Cholangiographic images from the C-arm fluoroscopic device were submitted for interpretation post-operatively. Please see the procedural report for the amount of contrast and the fluoroscopy time utilized. COMPARISON:  Ultrasound 03/02/2016, CT 03/02/2016 FINDINGS: Surgical  instruments project over the upper abdomen. There is cannulation of the cystic duct/gallbladder neck, with antegrade infusion of contrast. Caliber of the extrahepatic ductal system within normal limits. Vague filling defects within the common bile duct. Contrast does not cross the ampulla into the duodenum. Extraluminal contrast at the site of cystic duct cannulation and at the inferior liver margin. IMPRESSION: Intraoperative cholangiogram demonstrates no contrast traversing the ampulla with vague filling defects within the common bile duct potentially representing retained debris/ stones. Please refer to the dictated operative report for full details of intraoperative findings and procedure Signed, Yvone NeuJaime S. Loreta AveWagner, DO [facility] Electronically Signed   By: Gilmer MorJaime  Wagner D.O.   On: 03/04/2016 09:20   Ct Renal Stone Study  Result Date: 03/02/2016 CLINICAL DATA:  Right flank pain. EXAM: CT ABDOMEN AND PELVIS WITHOUT CONTRAST TECHNIQUE: Multidetector CT imaging of the abdomen and pelvis was performed following the standard protocol without IV contrast. COMPARISON:  Abdominal ultrasound 02/18/2016 FINDINGS: Lower chest: Trace bilateral pleural fluid. No visualized pneumonia. Hepatobiliary: Negative liver. Cholelithiasis with calcified stone seen near the neck. Gallbladder is full but there is no convincing pericholecystic inflammation. Pancreas: Normal Spleen: Normal Adrenals/Urinary Tract: Negative adrenal glands. No hydronephrosis or renal calculus. Negative urinary bladder. Stomach/Bowel: No obstruction or inflammation.  Normal appendix. Vascular/Lymphatic: Negative Reproductive: Negative Other: The lower peritoneum may be mildly thickened, suggesting previous inflammation. No ascites or pneumoperitoneum. Musculoskeletal: Negative IMPRESSION: 1. Cholelithiasis without evidence of cholecystitis. 2. Trace bilateral pleural fluid. Electronically Signed    By: Marnee SpringJonathon  Watts M.D.   On: 03/02/2016 17:18   [place] Abdomen Limited Ruq  Result Date: 03/02/2016 CLINICAL DATA:  Subacute onset of right upper quadrant abdominal pain. Initial encounter. EXAM: US ABDOMEN LIMITED - RIGHT UPPER QUADRANT COMPARISON:  CT of the abdomen and pelvis performed earlier today at 5:00 p.m. FINDINGS: Gallbladder: Multiple stones are noted within the gallbladder, measuring up to 9 mm in size. No gallbladder wall thickening or pericholecystic fluid is seen. Mild sludge is noted within the gallbladder. No ultrasonographic Murphy's sign is elicited. Common bile duct: Diameter: 0.3 cm, within normal limits in caliber. Liver: No focal lesion identified. Within normal limits in parenchymal echogenicity. The left hepatic lobe is not well characterized due to overlying bowel gas. IMPRESSION: No evidence of cholecystitis or obstruction. Cholelithiasis and mild sludge within the gallbladder. Electronically Signed   By: Roanna RaiderJeffery  Chang M.D.   On: 03/02/2016 19:32    Scheduled Meds: . cefTRIAXone (ROCEPHIN)  IV  1 g Intravenous Q24H  . heparin subcutaneous  5,000 Units Subcutaneous Q8H  . HYDROmorphone      . Influenza vac split quadrivalent PF  0.5 mL Intramuscular Tomorrow-1000   Continuous Infusions: . sodium chloride 75 mL/hr at 03/04/16 1031    Principal Problem:   Choledocholithiasis with obstruction Active Problems:   Acute cholecystitis s/p lap cholecystectomy 03/04/2016   RUQ pain   Gastritis    Time spent: 25 minutes    Vassie LollMadera, Nataly Pacifico  [facility] Pager [phone]. If 7PM-7AM, please contact night-coverage at [email or web address], password [facility] [address] 03/04/2016, 4:18 PM  LOS: 1 day

## 2016-03-04 NOTE — Anesthesia Preprocedure Evaluation (Signed)
Anesthesia Evaluation  Patient identified by MRN, date of birth, ID band Patient awake    Reviewed: Allergy & Precautions, NPO status , Patient's Chart, lab work & pertinent test results  Airway Mallampati: III  TM Distance: >3 FB Neck ROM: Full    Dental  (+) Dental Advisory Given   Pulmonary neg COPD,    breath sounds clear to auscultation       Cardiovascular (-) hypertension(-) angina(-) CAD  Rhythm:Regular Rate:Normal     Neuro/Psych neg Seizures    GI/Hepatic negative GI ROS, Neg liver ROS,   Endo/Other  negative endocrine ROS  Renal/GU negative Renal ROS     Musculoskeletal   Abdominal   Peds  Hematology negative hematology ROS (+)   Anesthesia Other Findings   Reproductive/Obstetrics                             Lab Results  Component Value Date   WBC 4.0 03/03/2016   HGB 11.8 (L) 03/03/2016   HCT 35.0 (L) 03/03/2016   MCV 90.0 03/03/2016   PLT 184 03/03/2016   Lab Results  Component Value Date   CREATININE 0.50 03/03/2016   BUN 9 03/03/2016   NA 138 03/03/2016   K 4.0 03/03/2016   CL 111 03/03/2016   CO2 24 03/03/2016    Anesthesia Physical Anesthesia Plan  ASA: I  Anesthesia Plan: General   Post-op Pain Management:    Induction: Intravenous  Airway Management Planned: Oral ETT  Additional Equipment:   Intra-op Plan:   Post-operative Plan: Extubation in OR  Informed Consent: I have reviewed the patients History and Physical, chart, labs and discussed the procedure including the risks, benefits and alternatives for the proposed anesthesia with the patient or authorized representative who has indicated his/her understanding and acceptance.   Dental advisory given  Plan Discussed with: CRNA  Anesthesia Plan Comments:         Anesthesia Quick Evaluation

## 2016-03-04 NOTE — H&P (View-Only) (Signed)
Reason for Consult:biliary colic Referring Physician: Dr Armbruster  Chelsey Kemp is an 25 y.o. female.  HPI: This is a 25-year-old non-English-speaking Hispanic female who reports about a 2 week history of upper abdominal pain mainly in the upper abdomen more so on the right side. It is associated with nausea at times. She has had vomiting at times with it. She went to the emergency room on November 10 with the same complaints. An ultrasound during that visit was unremarkable for cholelithiasis and she was discharged with PPI for presumed gastritis. She came back in last night for recurrent upper abdominal right-sided pain. It was worsening. She was found to have very elevated transaminases but a normal white blood cell count. Ultrasound this time showed numerous gallstones without evidence of choledocholithiasis or dilated common bile duct. CT also showed gallstones without any other abnormal pathology. Today her LFTs are trending down and it was felt that she had passed a gallstone and we were asked to evaluate her for symptomatic cholelithiasis  History obtained via video interpreter services Cristian [id number]  Past Medical History:  Diagnosis Date  . Gastritis   . Pre-eclampsia     Past Surgical History:  Procedure Laterality Date  . CESAREAN SECTION      History reviewed. No pertinent family history.  Social History:  reports that she has never smoked. She has never used smokeless tobacco. She reports that she does not drink alcohol. Her drug history is not on file.  Allergies: No Known Allergies  Medications: I have reviewed the patient's current medications.  Results for orders placed or performed during the hospital encounter of 03/02/16 (from the past 48 hour(s))  Comprehensive metabolic panel     Status: Abnormal   Collection Time: 03/02/16  3:04 PM  Result Value Ref Range   Sodium 139 135 - 145 mmol/L   Potassium 4.1 3.5 - 5.1 mmol/L   Chloride 107 101 - 111 mmol/L   CO2 27 22 - 32 mmol/L   Glucose, Bld 115 (H) 65 - 99 mg/dL   BUN 13 6 - 20 mg/dL   Creatinine, Ser 0.71 0.44 - 1.00 mg/dL   Calcium 9.5 8.9 - 10.3 mg/dL   Total Protein 7.7 6.5 - 8.1 g/dL   Albumin 4.9 3.5 - 5.0 g/dL   AST 1,372 (H) 15 - 41 U/L   ALT 880 (H) 14 - 54 U/L   Alkaline Phosphatase 119 38 - 126 U/L   Total Bilirubin 1.1 0.3 - 1.2 mg/dL   GFR calc non Af Amer >60 >60 mL/min   GFR calc Af Amer >60 >60 mL/min    Comment: (NOTE) The eGFR has been calculated using the CKD EPI equation. This calculation has not been validated in all clinical situations. eGFR's persistently <60 mL/min signify possible Chronic Kidney Disease.    Anion gap 5 5 - 15  CBC with Differential     Status: None   Collection Time: 03/02/16  3:04 PM  Result Value Ref Range   WBC 5.0 4.0 - 10.5 K/uL   RBC 4.49 3.87 - 5.11 MIL/uL   Hemoglobin 13.6 12.0 - 15.0 g/dL   HCT 40.1 36.0 - 46.0 %   MCV 89.3 78.0 - 100.0 fL   MCH 30.3 26.0 - 34.0 pg   MCHC 33.9 30.0 - 36.0 g/dL   RDW 13.2 11.5 - 15.5 %   Platelets 221 150 - 400 K/uL   Neutrophils Relative % 73 %   Neutro Abs 3.6 1.7 - 7.7   K/uL   Lymphocytes Relative 23 %   Lymphs Abs 1.2 0.7 - 4.0 K/uL   Monocytes Relative 4 %   Monocytes Absolute 0.2 0.1 - 1.0 K/uL   Eosinophils Relative 0 %   Eosinophils Absolute 0.0 0.0 - 0.7 K/uL   Basophils Relative 0 %   Basophils Absolute 0.0 0.0 - 0.1 K/uL  I-Stat beta hCG blood, ED     Status: None   Collection Time: 03/02/16  3:25 PM  Result Value Ref Range   I-stat hCG, quantitative <5.0 <5 mIU/mL   Comment 3            Comment:   GEST. AGE      CONC.  (mIU/mL)   <=1 WEEK        5 - 50     2 WEEKS       50 - 500     3 WEEKS       100 - 10,000     4 WEEKS     1,000 - 30,000        FEMALE AND NON-PREGNANT FEMALE:     LESS THAN 5 mIU/mL   Protime-INR     Status: None   Collection Time: 03/02/16  7:00 PM  Result Value Ref Range   Prothrombin Time 13.2 11.4 - 15.2 seconds   INR 1.00   Type and screen  [facility]     Status: None   Collection Time: 03/02/16  7:00 PM  Result Value Ref Range   ABO/RH(D) O POS    Antibody Screen NEG    Sample Expiration 03/05/2016   Lipase, blood     Status: None   Collection Time: 03/02/16  7:00 PM  Result Value Ref Range   Lipase 23 11 - 51 U/L  Mononucleosis screen     Status: None   Collection Time: 03/02/16  7:00 PM  Result Value Ref Range   Mono Screen NEGATIVE NEGATIVE  ABO/Rh     Status: None   Collection Time: 03/02/16  7:01 PM  Result Value Ref Range   ABO/RH(D) O POS   Rapid HIV screen (HIV 1/2 Ab+Ag)     Status: None   Collection Time: 03/02/16  7:01 PM  Result Value Ref Range   HIV-1 P24 Antigen - HIV24 NON REACTIVE NON REACTIVE   HIV 1/2 Antibodies NON REACTIVE NON REACTIVE   Interpretation (HIV Ag Ab)      A non reactive test result means that HIV 1 or HIV 2 antibodies and HIV 1 p24 antigen were not detected in the specimen.    Comment: RESULT CALLED TO, READ BACK BY AND VERIFIED WITH: A DAVY RN [id number] 03/02/16 A NAVARRO   Urinalysis, Routine w reflex microscopic     Status: Abnormal   Collection Time: 03/02/16  7:22 PM  Result Value Ref Range   Color, Urine RED (A) YELLOW    Comment: BIOCHEMICALS MAY BE AFFECTED BY COLOR   APPearance TURBID (A) CLEAR   Specific Gravity, Urine 1.022 1.005 - 1.030   pH 7.5 5.0 - 8.0   Glucose, UA NEGATIVE NEGATIVE mg/dL   Hgb urine dipstick LARGE (A) NEGATIVE   Bilirubin Urine MODERATE (A) NEGATIVE   Ketones, ur 15 (A) NEGATIVE mg/dL   Protein, ur 100 (A) NEGATIVE mg/dL   Nitrite POSITIVE (A) NEGATIVE   Leukocytes, UA MODERATE (A) NEGATIVE  Urine microscopic-add on     Status: Abnormal   Collection Time: 03/02/16  7:22 PM    Result Value Ref Range   Squamous Epithelial / LPF 6-30 (A) NONE SEEN   WBC, UA 6-30 0 - 5 WBC/hpf   RBC / HPF TOO NUMEROUS TO COUNT 0 - 5 RBC/hpf   Bacteria, UA FEW (A) NONE SEEN  Rapid urine drug screen (hospital performed)     Status: Abnormal    Collection Time: 03/02/16  7:22 PM  Result Value Ref Range   Opiates POSITIVE (A) NONE DETECTED   Cocaine NONE DETECTED NONE DETECTED   Benzodiazepines NONE DETECTED NONE DETECTED   Amphetamines NONE DETECTED NONE DETECTED   Tetrahydrocannabinol NONE DETECTED NONE DETECTED   Barbiturates NONE DETECTED NONE DETECTED    Comment:        DRUG SCREEN FOR MEDICAL PURPOSES ONLY.  IF CONFIRMATION IS NEEDED FOR ANY PURPOSE, NOTIFY LAB WITHIN 5 DAYS.        LOWEST DETECTABLE LIMITS FOR URINE DRUG SCREEN Drug Class       Cutoff (ng/mL) Amphetamine      1000 Barbiturate      200 Benzodiazepine   200 Tricyclics       300 Opiates          300 Cocaine          300 THC              50   Acetaminophen level     Status: Abnormal   Collection Time: 03/02/16  8:06 PM  Result Value Ref Range   Acetaminophen (Tylenol), Serum <10 (L) 10 - 30 ug/mL    Comment:        THERAPEUTIC CONCENTRATIONS VARY SIGNIFICANTLY. A RANGE OF 10-30 ug/mL MAY BE AN EFFECTIVE CONCENTRATION FOR MANY PATIENTS. HOWEVER, SOME ARE BEST TREATED AT CONCENTRATIONS OUTSIDE THIS RANGE. ACETAMINOPHEN CONCENTRATIONS >150 ug/mL AT 4 HOURS AFTER INGESTION AND >50 ug/mL AT 12 HOURS AFTER INGESTION ARE OFTEN ASSOCIATED WITH TOXIC REACTIONS.   Comprehensive metabolic panel     Status: Abnormal   Collection Time: 03/03/16  5:03 AM  Result Value Ref Range   Sodium 138 135 - 145 mmol/L   Potassium 4.0 3.5 - 5.1 mmol/L   Chloride 111 101 - 111 mmol/L   CO2 24 22 - 32 mmol/L   Glucose, Bld 94 65 - 99 mg/dL   BUN 9 6 - 20 mg/dL   Creatinine, Ser 0.50 0.44 - 1.00 mg/dL   Calcium 8.3 (L) 8.9 - 10.3 mg/dL   Total Protein 5.8 (L) 6.5 - 8.1 g/dL   Albumin 3.4 (L) 3.5 - 5.0 g/dL   AST 410 (H) 15 - 41 U/L   ALT 665 (H) 14 - 54 U/L   Alkaline Phosphatase 120 38 - 126 U/L   Total Bilirubin 1.6 (H) 0.3 - 1.2 mg/dL   GFR calc non Af Amer >60 >60 mL/min   GFR calc Af Amer >60 >60 mL/min    Comment: (NOTE) The eGFR has been  calculated using the CKD EPI equation. This calculation has not been validated in all clinical situations. eGFR's persistently <60 mL/min signify possible Chronic Kidney Disease.    Anion gap 3 (L) 5 - 15  CBC     Status: Abnormal   Collection Time: 03/03/16  5:03 AM  Result Value Ref Range   WBC 4.0 4.0 - 10.5 K/uL   RBC 3.89 3.87 - 5.11 MIL/uL   Hemoglobin 11.8 (L) 12.0 - 15.0 g/dL   HCT 35.0 (L) 36.0 - 46.0 %   MCV 90.0 78.0 - 100.0 fL     MCH 30.3 26.0 - 34.0 pg   MCHC 33.7 30.0 - 36.0 g/dL   RDW 13.4 11.5 - 15.5 %   Platelets 184 150 - 400 K/uL    Ct Renal Stone Study  Result Date: 03/02/2016 CLINICAL DATA:  Right flank pain. EXAM: CT ABDOMEN AND PELVIS WITHOUT CONTRAST TECHNIQUE: Multidetector CT imaging of the abdomen and pelvis was performed following the standard protocol without IV contrast. COMPARISON:  Abdominal ultrasound 02/18/2016 FINDINGS: Lower chest: Trace bilateral pleural fluid. No visualized pneumonia. Hepatobiliary: Negative liver. Cholelithiasis with calcified stone seen near the neck. Gallbladder is full but there is no convincing pericholecystic inflammation. Pancreas: Normal Spleen: Normal Adrenals/Urinary Tract: Negative adrenal glands. No hydronephrosis or renal calculus. Negative urinary bladder. Stomach/Bowel: No obstruction or inflammation.  Normal appendix. Vascular/Lymphatic: Negative Reproductive: Negative Other: The lower peritoneum may be mildly thickened, suggesting previous inflammation. No ascites or pneumoperitoneum. Musculoskeletal: Negative IMPRESSION: 1. Cholelithiasis without evidence of cholecystitis. 2. Trace bilateral pleural fluid. Electronically Signed   By: Jonathon  Watts M.D.   On: 03/02/2016 17:18   Us Abdomen Limited Ruq  Result Date: 03/02/2016 CLINICAL DATA:  Subacute onset of right upper quadrant abdominal pain. Initial encounter. EXAM: US ABDOMEN LIMITED - RIGHT UPPER QUADRANT COMPARISON:  CT of the abdomen and pelvis performed  earlier today at 5:00 p.m. FINDINGS: Gallbladder: Multiple stones are noted within the gallbladder, measuring up to 9 mm in size. No gallbladder wall thickening or pericholecystic fluid is seen. Mild sludge is noted within the gallbladder. No ultrasonographic Murphy's sign is elicited. Common bile duct: Diameter: 0.3 cm, within normal limits in caliber. Liver: No focal lesion identified. Within normal limits in parenchymal echogenicity. The left hepatic lobe is not well characterized due to overlying bowel gas. IMPRESSION: No evidence of cholecystitis or obstruction. Cholelithiasis and mild sludge within the gallbladder. Electronically Signed   By: Jeffery  Chang M.D.   On: 03/02/2016 19:32    Review of Systems  Constitutional: Negative for weight loss.  HENT: Negative for nosebleeds.   Eyes: Negative for blurred vision.  Respiratory: Negative for shortness of breath.   Cardiovascular: Negative for chest pain, palpitations, orthopnea and PND.       Denies DOE  Gastrointestinal: Positive for abdominal pain, nausea and vomiting. Negative for blood in stool and melena.  Genitourinary: Negative for dysuria and hematuria.  Musculoskeletal: Negative.   Skin: Negative for itching and rash.  Neurological: Negative for dizziness, focal weakness, seizures, loss of consciousness and headaches.  Endo/Heme/Allergies: Does not bruise/bleed easily.  Psychiatric/Behavioral: The patient is not nervous/anxious.    Blood pressure 109/69, pulse 61, temperature 98.1 F (36.7 C), temperature source Oral, resp. rate 16, last menstrual period 02/28/2016, SpO2 99 %, unknown if currently breastfeeding. Physical Exam  Vitals reviewed. Constitutional: She is oriented to person, place, and time. She appears well-developed and well-nourished. No distress.  obese  HENT:  Head: Normocephalic and atraumatic.  Right Ear: External ear normal.  Left Ear: External ear normal.  Eyes: Conjunctivae are normal. No scleral  icterus.  Neck: Normal range of motion. Neck supple. No tracheal deviation present. No thyromegaly present.  Cardiovascular: Normal rate and normal heart sounds.   Respiratory: Effort normal and breath sounds normal. No stridor. No respiratory distress. She has no wheezes.  GI: Soft. She exhibits no distension. There is tenderness in the right upper quadrant and epigastric area. There is no rigidity, no rebound and no guarding.    Musculoskeletal: She exhibits no edema or tenderness.  Lymphadenopathy:      She has no cervical adenopathy.  Neurological: She is alert and oriented to person, place, and time. She exhibits normal muscle tone.  Skin: Skin is warm and dry. No rash noted. She is not diaphoretic. No erythema. No pallor.  Psychiatric: She has a normal mood and affect. Her behavior is normal. Judgment and thought content normal.    Assessment/Plan: Symptomatic cholelithiasis  Possible early cholecystitis Elevated transaminases  I believe the patient's symptoms are consistent with gallbladder disease. I believe she probably passed a gallstone. Right now there is no overt evidence of common bile duct stone. We did talk about the possibility of needing additional procedures after cholecystectomy should her intraoperative cholangiogram be positive  We discussed gallbladder disease. We discussed non-operative and operative management. We discussed the signs & symptoms of acute cholecystitis  I discussed laparoscopic cholecystectomy with IOC in detail.  The patient was shown diagrams detailing the procedure.  We discussed the risks and benefits of a laparoscopic cholecystectomy including, but not limited to bleeding, infection, injury to surrounding structures such as the intestine or liver, bile leak, retained gallstones, need to convert to an open procedure, prolonged diarrhea, blood clots such as  DVT, common bile duct injury, anesthesia risks, and possible need for additional procedures.   We discussed the typical post-operative recovery course. I explained that the likelihood of improvement of their symptoms is good.  We will plan for laparoscopic cholecystectomy with cholangiogram tomorrow morning. She can have clear liquids today and nothing by mouth after midnight. IV antibiotics on call to surgery. She can have a dose of chemical DVT prophylaxis today which I will order  Jaikob Borgwardt M. May Manrique, MD, FACS General, Bariatric, & Minimally Invasive Surgery [facility]    Brighton Pilley M 03/03/2016, 2:25 PM     

## 2016-03-05 ENCOUNTER — Inpatient Hospital Stay (HOSPITAL_COMMUNITY): Payer: Self-pay | Admitting: Anesthesiology

## 2016-03-05 ENCOUNTER — Inpatient Hospital Stay (HOSPITAL_COMMUNITY): Payer: Self-pay

## 2016-03-05 ENCOUNTER — Encounter (HOSPITAL_COMMUNITY): Payer: Self-pay | Admitting: Anesthesiology

## 2016-03-05 ENCOUNTER — Encounter (HOSPITAL_COMMUNITY)
Admission: EM | Disposition: A | Discharge status: Home / Self Care | Payer: Self-pay | Source: Home / Self Care | Attending: Internal Medicine

## 2016-03-05 DIAGNOSIS — K8051 Calculus of bile duct without cholangitis or cholecystitis with obstruction: Secondary | ICD-10-CM

## 2016-03-05 DIAGNOSIS — K8050 Calculus of bile duct without cholangitis or cholecystitis without obstruction: Secondary | ICD-10-CM

## 2016-03-05 HISTORY — PX: ERCP: SHX5425

## 2016-03-05 LAB — COMPREHENSIVE METABOLIC PANEL
ALT: 464 U/L — ABNORMAL HIGH (ref 14–54)
ANION GAP: 7 (ref 5–15)
AST: 170 U/L — AB (ref 15–41)
Albumin: 3.8 g/dL (ref 3.5–5.0)
Alkaline Phosphatase: 145 U/L — ABNORMAL HIGH (ref 38–126)
BILIRUBIN TOTAL: 3.4 mg/dL — AB (ref 0.3–1.2)
BUN: 8 mg/dL (ref 6–20)
CHLORIDE: 105 mmol/L (ref 101–111)
CO2: 25 mmol/L (ref 22–32)
Calcium: 9.4 mg/dL (ref 8.9–10.3)
Creatinine, Ser: 0.58 mg/dL (ref 0.44–1.00)
Glucose, Bld: 99 mg/dL (ref 65–99)
Potassium: 3.9 mmol/L (ref 3.5–5.1)
Sodium: 137 mmol/L (ref 135–145)
TOTAL PROTEIN: 6.8 g/dL (ref 6.5–8.1)

## 2016-03-05 LAB — CBC
HEMATOCRIT: 35.9 % — AB (ref 36.0–46.0)
HEMOGLOBIN: 12.1 g/dL (ref 12.0–15.0)
MCH: 30.1 pg (ref 26.0–34.0)
MCHC: 33.7 g/dL (ref 30.0–36.0)
MCV: 89.3 fL (ref 78.0–100.0)
Platelets: 191 10*3/uL (ref 150–400)
RBC: 4.02 MIL/uL (ref 3.87–5.11)
RDW: 13.4 % (ref 11.5–15.5)
WBC: 10.8 10*3/uL — AB (ref 4.0–10.5)

## 2016-03-05 SURGERY — ERCP, WITH INTERVENTION IF INDICATED
Anesthesia: General

## 2016-03-05 MED ORDER — GLUCAGON HCL RDNA (DIAGNOSTIC) 1 MG IJ SOLR
INTRAMUSCULAR | Status: AC
  Filled 2016-03-05: qty 1

## 2016-03-05 MED ORDER — LIDOCAINE 2% (20 MG/ML) 5 ML SYRINGE
INTRAMUSCULAR | Status: AC
Start: 2016-03-05 — End: 2016-03-05
  Filled 2016-03-05: qty 5

## 2016-03-05 MED ORDER — FENTANYL CITRATE (PF) 100 MCG/2ML IJ SOLN
INTRAMUSCULAR | Status: DC | PRN
  Administered 2016-03-05 (×2): 50 ug via INTRAVENOUS

## 2016-03-05 MED ORDER — LACTATED RINGERS IV SOLN
INTRAVENOUS | Status: DC | PRN
  Administered 2016-03-05: 12:00:00 via INTRAVENOUS

## 2016-03-05 MED ORDER — PROPOFOL 10 MG/ML IV BOLUS
INTRAVENOUS | Status: AC
  Filled 2016-03-05: qty 20

## 2016-03-05 MED ORDER — ONDANSETRON HCL 4 MG/2ML IJ SOLN
INTRAMUSCULAR | Status: AC
  Filled 2016-03-05: qty 2

## 2016-03-05 MED ORDER — LIDOCAINE 2% (20 MG/ML) 5 ML SYRINGE
INTRAMUSCULAR | Status: DC | PRN
  Administered 2016-03-05: 80 mg via INTRAVENOUS

## 2016-03-05 MED ORDER — PROPOFOL 10 MG/ML IV BOLUS
INTRAVENOUS | Status: DC | PRN
  Administered 2016-03-05: 150 mg via INTRAVENOUS

## 2016-03-05 MED ORDER — MIDAZOLAM HCL 2 MG/2ML IJ SOLN
INTRAMUSCULAR | Status: AC
  Filled 2016-03-05: qty 2

## 2016-03-05 MED ORDER — GLUCAGON HCL RDNA (DIAGNOSTIC) 1 MG IJ SOLR
INTRAMUSCULAR | Status: DC | PRN
  Administered 2016-03-05: 1 mg via INTRAVENOUS

## 2016-03-05 MED ORDER — SUCCINYLCHOLINE CHLORIDE 200 MG/10ML IV SOSY
PREFILLED_SYRINGE | INTRAVENOUS | Status: DC | PRN
  Administered 2016-03-05: 100 mg via INTRAVENOUS

## 2016-03-05 MED ORDER — SCOPOLAMINE 1 MG/3DAYS TD PT72
MEDICATED_PATCH | TRANSDERMAL | Status: AC
  Filled 2016-03-05: qty 1

## 2016-03-05 MED ORDER — ONDANSETRON HCL 4 MG/2ML IJ SOLN
INTRAMUSCULAR | Status: DC | PRN
  Administered 2016-03-05: 4 mg via INTRAVENOUS

## 2016-03-05 MED ORDER — FENTANYL CITRATE (PF) 100 MCG/2ML IJ SOLN
INTRAMUSCULAR | Status: AC
  Filled 2016-03-05: qty 2

## 2016-03-05 MED ORDER — MIDAZOLAM HCL 5 MG/5ML IJ SOLN
INTRAMUSCULAR | Status: DC | PRN
  Administered 2016-03-05: 2 mg via INTRAVENOUS

## 2016-03-05 MED ORDER — DEXAMETHASONE SODIUM PHOSPHATE 10 MG/ML IJ SOLN
INTRAMUSCULAR | Status: AC
  Filled 2016-03-05: qty 1

## 2016-03-05 MED ORDER — SUCCINYLCHOLINE CHLORIDE 200 MG/10ML IV SOSY
PREFILLED_SYRINGE | INTRAVENOUS | Status: AC
  Filled 2016-03-05: qty 10

## 2016-03-05 MED ORDER — IOPAMIDOL (ISOVUE-370) INJECTION 76%
INTRAVENOUS | Status: DC | PRN
  Administered 2016-03-05: 50 mL via INTRAVENOUS

## 2016-03-05 MED ORDER — DEXAMETHASONE SODIUM PHOSPHATE 10 MG/ML IJ SOLN
INTRAMUSCULAR | Status: DC | PRN
  Administered 2016-03-05: 10 mg via INTRAVENOUS

## 2016-03-05 MED ORDER — SCOPOLAMINE 1 MG/3DAYS TD PT72
1.0000 | MEDICATED_PATCH | TRANSDERMAL | Status: DC
  Administered 2016-03-05: 1 via TRANSDERMAL
  Filled 2016-03-05: qty 1

## 2016-03-05 MED ORDER — LIDOCAINE 2% (20 MG/ML) 5 ML SYRINGE
INTRAMUSCULAR | Status: AC
  Filled 2016-03-05: qty 5

## 2016-03-05 NOTE — Progress Notes (Signed)
[facility] PROGRESS NOTE  Chelsey Kemp LFY:[id number] DOB: [birth year] DOA: 03/02/2016 PCP: No PCP Per Patient  Interim summary and HPI 25 y.o. female with no significant past medical history who presents with 3 weeks right upper quadrant abdominal pain This was initially moderate in intensity, and intermittent. She went to the emergency room where a right upper quadrant ultrasound was negative, LFTs were normal and she was discharged with sucralfate and antacid. The pain persisted despite these treatments, and on the last 3 days, the pain has become intense, constant, worse with lying down, worse after eating, and associated with vomiting. She could no longer bear the pain so she came back to the ER.  Assessment/Plan: 1-cholelithiasis and biliary colic -patient w/o icterus -no fever and just mild elevation on WBC's -[facility] has performed lap cholecystectomy today; well tolerated. Abnormal IOC and GI planning ERCP on 11/26 -will follow rec's; diet to be advance as per GI and surgery rec's after ERCP today  -continue PRN pain meds and supportive care  2-transaminitis -significantly elevated AST/ALT on admission  -most likely passive congestion from cholelithiasis and passing/retained gallstone  -LFT's improving/trending down; ALK Phos and Bilirubin climbing today -hepatitis panel and EBV screen neg -HIV neg -abnormal results seen on IOC during laparoscopic cholecystectomy; after discussing with GI plans are for ERCP later today 11/26 -continue empiric rocephin; mild WBC's seen  3-N/V -most likely associated with #1 -will use PRN antiemetics  Code Status: Full Family Communication: Aunt at bedside  Disposition Plan: home when medically stable; laparoscopic surgery performed on 11/25, will follow up GI rec's, as there were on IOC filling defects and concerns for retain stone in distal aspect of cystic duct; plan is for ERCP later today 11/26.   Consultants:  General  Surgery  GI  Procedures:  See below for x-ray reports  Laparoscopic cholecystectomy with IOC (11/25)  ERCP 11/26  Antibiotics:  None   HPI/Subjective: Afebrile, no CP and no nausea or vomiting.   Objective: Vitals:   03/05/16 1410 03/05/16 1440  BP:  130/87  Pulse: 70 77  Resp: 17   Temp:  97.8 F (36.6 C)    Intake/Output Summary (Last 24 hours) at 03/05/16 1536 Last data filed at 03/05/16 1455  Gross per 24 hour  Intake             2280 ml  Output             2550 ml  Net             -270 ml   Filed Weights   03/04/16 0715 03/05/16 1156  Weight: 76.7 kg (169 lb) 76.7 kg (169 lb)    Exam:   General:  Afebrile, complaining of pain in her RUQ and around laparoscopic incisions (mainly umbilical one). No nausea or vomiting.     Cardiovascular: S1 and S2, no rubs, no gallops  Respiratory: CTA bilaterally  Abdomen: soft, mild guarding, tender from surgery (especially around RUQ and umbilical area); positive BS  Musculoskeletal: no edema, no cyanosis   Data Reviewed: Basic Metabolic Panel:  Recent Labs Lab 03/02/16 1504 03/03/16 0503 03/05/16 0456  NA 139 138 137  K 4.1 4.0 3.9  CL 107 111 105  CO2 _0 GLUCOSE 115* 94 99  BUN _1 CREATININE 0.71 0.50 0.58  CALCIUM 9.5 8.3* 9.4   Liver Function Tests:  Recent Labs Lab 03/02/16 1504 03/03/16 0503 03/05/16 0456  AST 1,372* 410* 170*  ALT 880*  665* 464*  ALKPHOS 119 120 145*  BILITOT 1.1 1.6* 3.4*  PROT 7.7 5.8* 6.8  ALBUMIN 4.9 3.4* 3.8    Recent Labs Lab 03/02/16 1900  LIPASE 23   CBC:  Recent Labs Lab 03/02/16 1504 03/03/16 0503 03/05/16 0456  WBC 5.0 4.0 10.8*  NEUTROABS 3.6  --   --   HGB 13.6 11.8* 12.1  HCT 40.1 35.0* 35.9*  MCV 89.3 90.0 89.3  PLT 221 184 191    Studies: Dg Cholangiogram Operative  Result Date: 03/04/2016 CLINICAL DATA:  26 year old female with a history of cholecystectomy EXAM: INTRAOPERATIVE CHOLANGIOGRAM TECHNIQUE:  Cholangiographic images from the C-arm fluoroscopic device were submitted for interpretation post-operatively. Please see the procedural report for the amount of contrast and the fluoroscopy time utilized. COMPARISON:  Ultrasound 03/02/2016, CT 03/02/2016 FINDINGS: Surgical instruments project over the upper abdomen. There is cannulation of the cystic duct/gallbladder neck, with antegrade infusion of contrast. Caliber of the extrahepatic ductal system within normal limits. Vague filling defects within the common bile duct. Contrast does not cross the ampulla into the duodenum. Extraluminal contrast at the site of cystic duct cannulation and at the inferior liver margin. IMPRESSION: Intraoperative cholangiogram demonstrates no contrast traversing the ampulla with vague filling defects within the common bile duct potentially representing retained debris/ stones. Please refer to the dictated operative report for full details of intraoperative findings and procedure Signed, Dulcy Fanny. Earleen Newport, DO [facility] Electronically Signed   By: Corrie Mckusick D.O.   On: 03/04/2016 09:20   Dg Ercp Biliary & Pancreatic Ducts  Result Date: 03/05/2016 CLINICAL DATA:  25 year old female undergoing sphincterotomy for stone removal EXAM: ERCP TECHNIQUE: Multiple spot images obtained with the fluoroscopic device and submitted for interpretation post-procedure. FLUOROSCOPY TIME:  Fluoroscopy Time:  6 minutes 31 seconds Radiation Exposure Index (if provided by the fluoroscopic device): 102.26 mGy COMPARISON:  Intraoperative cholangiogram 03/04/2016 FINDINGS: A total of 4 intraoperative spot images demonstrate a flexible endoscope in the duodenum with cannulation of the common bile duct and contrast opacification. There is a rounded filling defect in the distal common duct consistent with choledocholithiasis. Subsequent images demonstrate sphincterotomy and balloon sweep of the  common duct. IMPRESSION: 1. Choledocholithiasis with a small stone in the distal common bile duct. 2. Sphincterotomy and balloon sweep of the common duct. These images were submitted for radiologic interpretation only. Please see the procedural report for the amount of contrast and the fluoroscopy time utilized. Electronically Signed   By: Jacqulynn Cadet M.D.   On: 03/05/2016 14:56    Scheduled Meds: . cefTRIAXone (ROCEPHIN)  IV  1 g Intravenous Q24H  . Influenza vac split quadrivalent PF  0.5 mL Intramuscular Tomorrow-1000   Continuous Infusions: . sodium chloride 75 mL/hr at 03/05/16 1451    Principal Problem:   Choledocholithiasis with obstruction Active Problems:   Acute cholecystitis s/p lap cholecystectomy 03/04/2016   RUQ pain   Gastritis   Non-intractable vomiting with nausea   Elevated LFTs    Time spent: 25 minutes    Barton Dubois  [facility] Pager [phone]. If 7PM-7AM, please contact night-coverage at [email or web address], password [facility] 03/05/2016, 3:36 PM  LOS: 2 days

## 2016-03-05 NOTE — Progress Notes (Signed)
      Progress Note   Subjective  Patient sleeping this AM. I spoke with family / friends who were present and answered their questions. No issues overnight. WBC has risen as well as bili.    Objective   Vital signs in last 24 hours: Temp:  [97.9 F (36.6 C)-99.1 F (37.3 C)] 98.6 F (37 C) (11/26 0543) Pulse Rate:  [61-82] 79 (11/26 0543) Resp:  [10-18] 16 (11/26 0543) BP: (99-117)/(52-74) 100/53 (11/26 0543) SpO2:  [96 %-100 %] 98 % (11/26 0543) Last BM Date: 02/28/16 General:    [name] in NAD sleeping Not examined otherwise to allow patient to sleep  Intake/Output from previous day: 11/25 0701 - 11/26 0700 In: 2361.3 [I.V.:2261.3; IV Piggyback:100] Out: 3525 [Urine:3500; Blood:25] Intake/Output this shift: No intake/output data recorded.  Lab Results:  Recent Labs  03/02/16 1504 03/03/16 0503 03/05/16 0456  WBC 5.0 4.0 10.8*  HGB 13.6 11.8* 12.1  HCT 40.1 35.0* 35.9*  PLT 221 184 191   BMET  Recent Labs  03/02/16 1504 03/03/16 0503 03/05/16 0456  NA 139 138 137  K 4.1 4.0 3.9  CL 107 111 105  CO2 27 24 25   GLUCOSE 115* 94 99  BUN 13 9 8   CREATININE 0.71 0.50 0.58  CALCIUM 9.5 8.3* 9.4   LFT  Recent Labs  03/05/16 0456  PROT 6.8  ALBUMIN 3.8  AST 170*  ALT 464*  ALKPHOS 145*  BILITOT 3.4*   PT/INR  Recent Labs  03/02/16 1900  LABPROT 13.2  INR 1.00    Studies/Results: Dg Cholangiogram Operative  Result Date: 03/04/2016 CLINICAL DATA:  25 year old female with a history of cholecystectomy EXAM: INTRAOPERATIVE CHOLANGIOGRAM TECHNIQUE: Cholangiographic images from the C-arm fluoroscopic device were submitted for interpretation post-operatively. Please see the procedural report for the amount of contrast and the fluoroscopy time utilized. COMPARISON:  Ultrasound 03/02/2016, CT 03/02/2016 FINDINGS: Surgical instruments project over the upper abdomen. There is cannulation of the cystic duct/gallbladder neck, with antegrade infusion  of contrast. Caliber of the extrahepatic ductal system within normal limits. Vague filling defects within the common bile duct. Contrast does not cross the ampulla into the duodenum. Extraluminal contrast at the site of cystic duct cannulation and at the inferior liver margin. IMPRESSION: Intraoperative cholangiogram demonstrates no contrast traversing the ampulla with vague filling defects within the common bile duct potentially representing retained debris/ stones. Please refer to the dictated operative report for full details of intraoperative findings and procedure Signed, Yvone NeuJaime S. Loreta AveWagner, DO [facility] Electronically Signed   By: Gilmer MorJaime  Wagner D.O.   On: 03/04/2016 09:20       Assessment / Plan:   25 y/o female who initially presented with RUQ pain thought to be due to gallstones, underwent cholecystectomy with IOC yesterday which showed a retained gallstone. WBC has mildly elevated and bilirubin mildly elevated. She is scheduled for ERCP today with Dr. Madilyn FiremanHayes around noon. I have spent several minutes with family and patient previously discussing ERCP, including risks / benefits, and answering their questions. They had a few additional questions which I answered today, all are in agreement to proceed as outlined. We will await results. NPO for now until her procedure.   Ileene PatrickSteven Jersey Espinoza, MD [facility] Pager [phone]

## 2016-03-05 NOTE — Anesthesia Preprocedure Evaluation (Addendum)
Anesthesia Evaluation  Patient identified by MRN, date of birth, ID band Patient awake    Reviewed: Allergy & Precautions, NPO status , Patient's Chart, lab work & pertinent test results  Airway Mallampati: III  TM Distance: >3 FB Neck ROM: Full    Dental no notable dental hx. (+) Dental Advisory Given, Teeth Intact   Pulmonary neg pulmonary ROS, neg COPD,    Pulmonary exam normal breath sounds clear to auscultation       Cardiovascular hypertension, (-) angina(-) CAD Normal cardiovascular exam Rhythm:Regular Rate:Normal     Neuro/Psych neg Seizures negative neurological ROS  negative psych ROS   GI/Hepatic GERD  Medicated,Choledocholithiasis S/P Laparoscopic cholecystectomy yesterday for cholecystitis   Endo/Other  Obesity  Renal/GU negative Renal ROS  negative genitourinary   Musculoskeletal negative musculoskeletal ROS (+)   Abdominal (+) + obese,  Abdomen: tender.    Peds  Hematology negative hematology ROS (+)   Anesthesia Other Findings Patient complaining of sore throat and difficulty speaking since surgery yesterday.  Reproductive/Obstetrics negative OB ROS                            Lab Results  Component Value Date   WBC 10.8 (H) 03/05/2016   HGB 12.1 03/05/2016   HCT 35.9 (L) 03/05/2016   MCV 89.3 03/05/2016   PLT 191 03/05/2016   Lab Results  Component Value Date   CREATININE 0.58 03/05/2016   BUN 8 03/05/2016   NA 137 03/05/2016   K 3.9 03/05/2016   CL 105 03/05/2016   CO2 25 03/05/2016    Anesthesia Physical  Anesthesia Plan  ASA: II  Anesthesia Plan: General   Post-op Pain Management:    Induction: Intravenous  Airway Management Planned: Oral ETT  Additional Equipment:   Intra-op Plan:   Post-operative Plan: Extubation in OR  Informed Consent: I have reviewed the patients History and Physical, chart, labs and discussed the procedure including  the risks, benefits and alternatives for the proposed anesthesia with the patient or authorized representative who has indicated his/her understanding and acceptance.   Dental advisory given  Plan Discussed with: CRNA, Anesthesiologist and Surgeon  Anesthesia Plan Comments:         Anesthesia Quick Evaluation

## 2016-03-05 NOTE — Op Note (Signed)
[facility] Patient Name: Chelsey NevinBelkis Kemp Procedure Date: 03/05/2016 MRN: [id number] Attending MD: Barrie FolkJohn C Ferdie Bakken , MD Date of Birth: October 23, 1990 CSN: [id number] Age: [age] Admit Type: Inpatient Procedure:                ERCP Indications:              Suspected bile duct stone(s) Providers:                Everardo AllJohn C. Madilyn FiremanHayes, MD, Tomma RakersJennifer Kappus, RN, Janae SauceKaren D.                            Steele BergHinson, RN, Harrington ChallengerHope Parker, Technician, Oletha Blendavida                            Shoffner, Technician, Lorenda IshiharaSam Tetteh, Pensions consultantTechnician Referring MD:              Medicines:                General Anesthesia Complications:            No immediate complications. Estimated Blood Loss:     Estimated blood loss: none. Procedure:                Pre-Anesthesia Assessment:                           - Prior to the procedure, a History and Physical                            was performed, and patient medications and                            allergies were reviewed. The patient's tolerance of                            previous anesthesia was also reviewed. The risks                            and benefits of the procedure and the sedation                            options and risks were discussed with the patient.                            All questions were answered, and informed consent                            was obtained. Prior Anticoagulants: The patient has                            taken no previous anticoagulant or antiplatelet                            agents. ASA Grade Assessment: II - A patient with  mild systemic disease. After reviewing the risks                            and benefits, the patient was deemed in                            satisfactory condition to undergo the procedure.                           After obtaining informed consent, the scope was                            passed under direct vision. Throughout the                            procedure, the  patient's blood pressure, pulse, and                            oxygen saturations were monitored continuously. The                            was introduced through the mouth, and used to                            inject contrast into and used to inject contrast                            into the bile duct and ventral pancreatic duct. The                            ERCP was accomplished without difficulty. The                            patient tolerated the procedure well. Scope In: Scope Out: Findings:      The major papilla was normal. The minor papilla was not seen. The       ventral pancreatic duct was deeply cannulated. Contrast was injected. I       personally interpreted the bile duct and pancreatic duct images. There       was brisk flow of contrast through the ducts. Image quality was       excellent. Contrast extended to the entire biliary tree. Opacification       of the main pancreatic duct was done. The in the pancreas was normal. A       wire was passed into the biliary tree. An 8 mm biliary sphincterotomy       was made with a traction (standard) sphincterotome using ERBE       electrocautery. There was no post-sphincterotomy bleeding. The biliary       tree was swept with a 12 mm balloon starting at the lower third of the       main duct. One stone was removed. No stones remained. Impression:               - The major papilla appeared normal.                           -  Choledocholithiasis was found. Complete removal                            was accomplished by biliary sphincterotomy and                            balloon extraction.                           - A biliary sphincterotomy was performed.                           - The biliary tree was swept. Moderate Sedation:      no moderate sedation Recommendation:           - Observe patient's clinical course.                           - Clear liquid diet.                           - Continue present  medications.                           - Check liver enzymes (AST, ALT, alkaline                            phosphatase, bilirubin) in the morning. Procedure Code(s):        --- Professional ---                           [id number], Endoscopic retrograde                            cholangiopancreatography (ERCP); with removal of                            calculi/debris from biliary/pancreatic duct(s)                           43262, Endoscopic retrograde                            cholangiopancreatography (ERCP); with                            sphincterotomy/papillotomy Diagnosis Code(s):        --- Professional ---                           K80.50, Calculus of bile duct without cholangitis                            or cholecystitis without obstruction CPT copyright 2016 [employer] Association. All rights reserved. The codes documented in this report are preliminary and upon coder review may  be revised to meet current compliance requirements. Barrie Folk, MD 03/05/2016 1:26:01 PM This report has been signed electronically. Number of Addenda: 0

## 2016-03-05 NOTE — Anesthesia Postprocedure Evaluation (Signed)
Anesthesia Post Note  Patient: Chelsey Kemp  Procedure(s) Performed: Procedure(s) (LRB): ENDOSCOPIC RETROGRADE CHOLANGIOPANCREATOGRAPHY (ERCP) (N/A)  Patient location during evaluation: PACU Anesthesia Type: General Level of consciousness: awake and alert and oriented Pain management: pain level controlled Vital Signs Assessment: post-procedure vital signs reviewed and stable Respiratory status: spontaneous breathing, nonlabored ventilation, respiratory function stable and patient connected to nasal cannula oxygen Cardiovascular status: blood pressure returned to baseline and stable Postop Assessment: no signs of nausea or vomiting Anesthetic complications: no    Last Vitals:  Vitals:   03/05/16 1350 03/05/16 1355  BP: 138/83   Pulse: 81 85  Resp: 19 20  Temp:      Last Pain:  Vitals:   03/05/16 1333  TempSrc: Oral  PainSc:                  Jameika Kinn A.

## 2016-03-05 NOTE — Progress Notes (Signed)
Video interpreter was used for pt in pre procedure. Dr. Madilyn FiremanHayes, Dr. Malen GauzeFoster and [facility] CRNA along with endo staff used video interpreter to talk with pt and aunt  Regarding preocedure and questions answered.

## 2016-03-05 NOTE — Transfer of Care (Signed)
Immediate Anesthesia Transfer of Care Note  Patient: Chelsey Kemp  Procedure(s) Performed: Procedure(s): ENDOSCOPIC RETROGRADE CHOLANGIOPANCREATOGRAPHY (ERCP) (N/A)  Patient Location: PACU  Anesthesia Type:General  Level of Consciousness:  sedated, patient cooperative and responds to stimulation  Airway & Oxygen Therapy:Patient Spontanous Breathing and Patient connected to face mask oxgen  Post-op Assessment:  Report given to PACU RN and Post -op Vital signs reviewed and stable  Post vital signs:  Reviewed and stable  Last Vitals:  Vitals:   03/05/16 1156 03/05/16 1225  BP: (P) 119/66   Pulse: (P) 63   Resp: (P) 15   Temp:  36.9 C    Complications: No apparent anesthesia complications

## 2016-03-05 NOTE — Anesthesia Procedure Notes (Addendum)
Procedure Name: Intubation Date/Time: 03/05/2016 12:40 PM Performed by: Epimenio SarinJARVELA, Hien Cunliffe R Pre-anesthesia Checklist: Patient identified, Emergency Drugs available, Suction available, Patient being monitored and Timeout performed Patient Re-evaluated:Patient Re-evaluated prior to inductionOxygen Delivery Method: Circle system utilized Preoxygenation: Pre-oxygenation with 100% oxygen Intubation Type: IV induction, Cricoid Pressure applied and Rapid sequence Laryngoscope Size: Mac and 3 Grade View: Grade I Tube type: Oral Tube size: 6.5 mm Number of attempts: 1 Airway Equipment and Method: Stylet Placement Confirmation: ETT inserted through vocal cords under direct vision,  positive ETCO2 and breath sounds checked- equal and bilateral Secured at: 21 cm Tube secured with: Tape Dental Injury: Teeth and Oropharynx as per pre-operative assessment

## 2016-03-05 NOTE — Progress Notes (Signed)
Dr. Malen GauzeFoster by to see pt. Looks good . Can go back to floor.

## 2016-03-05 NOTE — Progress Notes (Signed)
[address] [place], [place] [address] Phone: [phone] FAX: [phone]   Chelsey Kemp [id number] March 22, 1991    Problem List:   Principal Problem:   Choledocholithiasis with obstruction Active Problems:   Acute cholecystitis s/p lap cholecystectomy 03/04/2016   RUQ pain   Gastritis   Non-intractable vomiting with nausea   Elevated LFTs   1 Day Post-Op  03/04/2016  Procedure(s): LAPAROSCOPIC CHOLECYSTECTOMY WITH INTRAOPERATIVE CHOLANGIOGRAM   Assessment  Stable but with choledocholithiasis and rising liver function tests  Plan:  ERCP today.  Advance diet as tolerated post procedure.  Pain control.  VTE prophylaxis- SCDs, etc  Mobilize as tolerated to help recovery  Adin Hector, M.D., F.A.C.S. Gastrointestinal and Minimally Invasive Surgery [facility] [address], [place], [place] [address] [phone] Main / Paging   03/05/2016  CARE TEAM:  PCP: No PCP Per Patient  Outpatient Care Team: Patient Care Team: No Pcp Per Patient as PCP - General (General Practice) Greer Pickerel, MD as Consulting Physician (General Surgery)  Inpatient Treatment Team: Treatment Team: Attending Provider: Barton Dubois, MD; Consulting Physician: Manus Gunning, MD; Technician: Leda Quail, NT; Technician: Etheleen Sia, NT; Rounding Team: Nolon Nations, MD; Rounding Team: Garner Gavel, MD; Consulting Physician: Teena Irani, MD; Registered Nurse: Celedonio Savage, RN  Subjective:  Pain control.  Sleeping.  No major events.  Objective:  Vital signs:  Vitals:   03/04/16 1816 03/04/16 2204 03/05/16 0205 03/05/16 0543  BP: 106/71 112/64 (!) 111/56 (!) 100/53  Pulse: 70 72 72 79  Resp: 15 15 15 16   Temp: 98.7 F (37.1 C) 98.3 F (36.8 C) 98 F (36.7 C) 98.6 F (37 C)  TempSrc: Oral Oral Oral Oral  SpO2: 100% 100% 100% 98%  Weight:       Height:        Last BM Date: 02/28/16  Intake/Output   Yesterday:  11/25 0701 - 11/26 0700 In: 2361.3 [I.V.:2261.3; IV Piggyback:100] Out: 6270 [Urine:3500; Blood:25] This shift:  No intake/output data recorded.  Bowel function:  Flatus: No  BM:  No  Drain:    Physical Exam:  General: Pt awake/alert/oriented x4 in No acute distress Eyes: PERRL, normal EOM.  Sclera clear.  No icterus Neuro: CN II-XII intact w/o focal sensory/motor deficits. Lymph: No head/neck/groin lymphadenopathy Psych:  No delerium/psychosis/paranoia HENT: Normocephalic, Mucus membranes moist.  No thrush Neck: Supple, No tracheal deviation Chest: No chest wall pain w good excursion CV:  Pulses intact.  Regular rhythm MS: Normal AROM mjr joints.  No obvious deformity Abdomen: Soft.  Mildy distended.  Mildly tender at incisions only.  No evidence of peritonitis.  No incarcerated hernias. Ext:  SCDs BLE.  No mjr edema.  No cyanosis Skin: No petechiae / purpura  Results:   Labs: Results for orders placed or performed during the hospital encounter of 03/02/16 (from the past 48 hour(s))  Surgical PCR screen     Status: None   Collection Time: 03/04/16  4:55 AM  Result Value Ref Range   MRSA, PCR NEGATIVE NEGATIVE   Staphylococcus aureus NEGATIVE NEGATIVE    Comment:        The Xpert SA Assay (FDA approved for NASAL specimens in patients over 70 years of age), is one component of a comprehensive surveillance program.  Test performance has been validated by [facility] for patients greater than or equal to 89 year old. It is not intended to diagnose  infection nor to guide or monitor treatment.   Comprehensive metabolic panel     Status: Abnormal   Collection Time: 03/05/16  4:56 AM  Result Value Ref Range   Sodium 137 135 - 145 mmol/L   Potassium 3.9 3.5 - 5.1 mmol/L   Chloride 105 101 - 111 mmol/L   CO2 25 22 - 32 mmol/L   Glucose, Bld 99 65 - 99 mg/dL   BUN 8 6 - 20 mg/dL   Creatinine,  Ser 0.58 0.44 - 1.00 mg/dL   Calcium 9.4 8.9 - 10.3 mg/dL   Total Protein 6.8 6.5 - 8.1 g/dL   Albumin 3.8 3.5 - 5.0 g/dL   AST 170 (H) 15 - 41 U/L   ALT 464 (H) 14 - 54 U/L   Alkaline Phosphatase 145 (H) 38 - 126 U/L   Total Bilirubin 3.4 (H) 0.3 - 1.2 mg/dL   GFR calc non Af Amer >60 >60 mL/min   GFR calc Af Amer >60 >60 mL/min    Comment: (NOTE) The eGFR has been calculated using the CKD EPI equation. This calculation has not been validated in all clinical situations. eGFR's persistently <60 mL/min signify possible Chronic Kidney Disease.    Anion gap 7 5 - 15  CBC     Status: Abnormal   Collection Time: 03/05/16  4:56 AM  Result Value Ref Range   WBC 10.8 (H) 4.0 - 10.5 K/uL   RBC 4.02 3.87 - 5.11 MIL/uL   Hemoglobin 12.1 12.0 - 15.0 g/dL   HCT 35.9 (L) 36.0 - 46.0 %   MCV 89.3 78.0 - 100.0 fL   MCH 30.1 26.0 - 34.0 pg   MCHC 33.7 30.0 - 36.0 g/dL   RDW 13.4 11.5 - 15.5 %   Platelets 191 150 - 400 K/uL    Imaging / Studies: Dg Cholangiogram Operative  Result Date: 03/04/2016 CLINICAL DATA:  25 year old female with a history of cholecystectomy EXAM: INTRAOPERATIVE CHOLANGIOGRAM TECHNIQUE: Cholangiographic images from the C-arm fluoroscopic device were submitted for interpretation post-operatively. Please see the procedural report for the amount of contrast and the fluoroscopy time utilized. COMPARISON:  Ultrasound 03/02/2016, CT 03/02/2016 FINDINGS: Surgical instruments project over the upper abdomen. There is cannulation of the cystic duct/gallbladder neck, with antegrade infusion of contrast. Caliber of the extrahepatic ductal system within normal limits. Vague filling defects within the common bile duct. Contrast does not cross the ampulla into the duodenum. Extraluminal contrast at the site of cystic duct cannulation and at the inferior liver margin. IMPRESSION: Intraoperative cholangiogram demonstrates no contrast traversing the ampulla with vague filling defects within the  common bile duct potentially representing retained debris/ stones. Please refer to the dictated operative report for full details of intraoperative findings and procedure Signed, Dulcy Fanny. Earleen Newport, DO [facility] Electronically Signed   By: Corrie Mckusick D.O.   On: 03/04/2016 09:20    Medications / Allergies: per chart  Antibiotics: Anti-infectives    Start     Dose/Rate Route Frequency Ordered Stop   03/04/16 1700  cefTRIAXone (ROCEPHIN) 1 g in dextrose 5 % 50 mL IVPB     1 g 100 mL/hr over 30 Minutes Intravenous Every 24 hours 03/04/16 1139     03/04/16 0600  cefoTEtan (CEFOTAN) 2 g in dextrose 5 % 50 mL IVPB     2 g 100 mL/hr over 30 Minutes Intravenous On call to O.R. 03/03/16 1414 03/04/16 0740        Note:  Portions of this report may have been transcribed using voice recognition software. Every effort was made to ensure accuracy; however, inadvertent computerized transcription errors may be present.   Any transcriptional errors that result from this process are unintentional.     Adin Hector, M.D., F.A.C.S. Gastrointestinal and Minimally Invasive Surgery [facility] [address], [place], [place] [address] [phone] Main / Paging   03/05/2016

## 2016-03-06 ENCOUNTER — Encounter (HOSPITAL_COMMUNITY): Payer: Self-pay | Admitting: Gastroenterology

## 2016-03-06 DIAGNOSIS — R1010 Upper abdominal pain, unspecified: Secondary | ICD-10-CM

## 2016-03-06 LAB — COMPREHENSIVE METABOLIC PANEL
ALK PHOS: 127 U/L — AB (ref 38–126)
ALT: 373 U/L — AB (ref 14–54)
AST: 99 U/L — AB (ref 15–41)
Albumin: 3.9 g/dL (ref 3.5–5.0)
Anion gap: 6 (ref 5–15)
BUN: 9 mg/dL (ref 6–20)
CALCIUM: 9 mg/dL (ref 8.9–10.3)
CO2: 27 mmol/L (ref 22–32)
CREATININE: 0.6 mg/dL (ref 0.44–1.00)
Chloride: 103 mmol/L (ref 101–111)
Glucose, Bld: 125 mg/dL — ABNORMAL HIGH (ref 65–99)
Potassium: 3.9 mmol/L (ref 3.5–5.1)
Sodium: 136 mmol/L (ref 135–145)
Total Bilirubin: 1.6 mg/dL — ABNORMAL HIGH (ref 0.3–1.2)
Total Protein: 6.5 g/dL (ref 6.5–8.1)

## 2016-03-06 LAB — CBC
HCT: 35.2 % — ABNORMAL LOW (ref 36.0–46.0)
Hemoglobin: 11.8 g/dL — ABNORMAL LOW (ref 12.0–15.0)
MCH: 30.1 pg (ref 26.0–34.0)
MCHC: 33.5 g/dL (ref 30.0–36.0)
MCV: 89.8 fL (ref 78.0–100.0)
PLATELETS: 185 10*3/uL (ref 150–400)
RBC: 3.92 MIL/uL (ref 3.87–5.11)
RDW: 13.8 % (ref 11.5–15.5)
WBC: 9.6 10*3/uL (ref 4.0–10.5)

## 2016-03-06 LAB — LIPASE, BLOOD: Lipase: 101 U/L — ABNORMAL HIGH (ref 11–51)

## 2016-03-06 MED ORDER — OXYCODONE HCL 5 MG PO TABS
5.0000 mg | ORAL_TABLET | ORAL | Status: DC | PRN
  Administered 2016-03-08: 5 mg via ORAL
  Filled 2016-03-06: qty 1

## 2016-03-06 NOTE — Progress Notes (Signed)
Patient ID: Chelsey Kemp, female   DOB: 06-27-1990, 25 y.o.   MRN: [id number]   LOS: 3 days   POD#2  Subjective: Pt very uncomfortable, having diffuse severe abd pain that's worse in epigastrium.   Objective: Vital signs in last 24 hours: Temp:  [97.8 F (36.6 C)-98.6 F (37 C)] 98.5 F (36.9 C) (11/27 0518) Pulse Rate:  [52-77] 54 (11/27 0518) Resp:  [14-16] 14 (11/27 0518) BP: (99-130)/(54-87) 99/54 (11/27 0518) SpO2:  [100 %] 100 % (11/27 0518) Last BM Date: 02/28/16   Laboratory  BMET  Recent Labs  03/05/16 0456 03/06/16 0428  NA 137 136  K 3.9 3.9  CL 105 103  CO2 25 27  GLUCOSE 99 125*  BUN 8 9  CREATININE 0.58 0.60  CALCIUM 9.4 9.0    Physical Exam General appearance: alert and mild distress Resp: clear to auscultation bilaterally Cardio: regular rate and rhythm GI: Soft, diminished BS, diffuse mod-severe TTP, no rebound, mild guarding   Assessment/Plan: Acute cholecystitis s/p lap cholecystectomy 11/25 Dr. Andrey CampanileWilson -- VS are reassuring. Differential includes pancreatitis, bile leak, early abscess. Await today's labs, check CBC and CMET in AM. Choledocholithiasis with obstruction s/p ERCP 11/26 -- GI advanced fluids and is checking for pancreatitis    Freeman CaldronMichael J. Veena Sturgess, PA-C Pager: [phone] 03/06/2016

## 2016-03-06 NOTE — Progress Notes (Signed)
Labs reviewed.  LFTs improving, lipase 101, WBC normal. Afebrile,  Not taking much PO. I increased her IVF earlier to 125ml / hr. Her 5pm vitals are stable.  -repeat CMET, lipase in am -Consider CTscan if doesn't improve  GI ATTENDING  Interval history data reviewed. Patient seen and examined. Moderate mid and right upper quadrant abdominal discomfort. Does not look toxic. Had ERCP yesterday as noted. Could have post-ERCP pancreatitis but typically expect much much higher level of lipase this early. Agree with repeat laboratories in a.m. Language barrier somewhat of an issue. Would have a low threshold for CT scan imaging, rule out micro-perforation post sphincterotomy. We'll follow.  Wilhemina BonitoJohn N. Eda KeysPerry, Jr., M.D. [facility] Division of Gastroenterology.

## 2016-03-06 NOTE — Progress Notes (Signed)
[facility] Progress Note  Subjective: doesn't feel well, diffuse abdominal pain. She has urinated, not hungry  Objective:  Vital signs in last 24 hours: Temp:  [97.8 F (36.6 C)-98.6 F (37 C)] 98.5 F (36.9 C) (11/27 0518) Pulse Rate:  [52-85] 54 (11/27 0518) Resp:  [14-20] 14 (11/27 0518) BP: (99-139)/(54-87) 99/54 (11/27 0518) SpO2:  [97 %-100 %] 100 % (11/27 0518) Last BM Date: 02/28/16 General:   Alert, well-developed, Hispanic female in NAD EENT:  Normal hearing, non icteric sclera, conjunctive pink.  Heart:  Regular rate and rhythm; no murmurs. No lower extremity edema Pulm: Normal respiratory effort, lungs CTA bilaterally without wheezes or crackles. Abdomen:  Soft, nondistended, Mild diffuse tenderness, hypoactive bowel sounds.    Neurologic:  Alert and  oriented x4;  grossly normal neurologically. Psych:  Alert and cooperative. Normal mood and affect. Skin:   Intake/Output from previous day: 11/26 0701 - 11/27 0700 In: 2340 [P.O.:60; I.V.:2230; IV Piggyback:50] Out: 2850 [Urine:2850] Intake/Output this shift: Total I/O In: 300 [I.V.:300] Out: 300 [Urine:300]  Lab Results:  Recent Labs  03/05/16 0456  WBC 10.8*  HGB 12.1  HCT 35.9*  PLT 191   BMET  Recent Labs  03/05/16 0456 03/06/16 0428  NA 137 136  K 3.9 3.9  CL 105 103  CO2 25 27  GLUCOSE 99 125*  BUN 8 9  CREATININE 0.58 0.60  CALCIUM 9.4 9.0   LFT    Ref. Range 03/05/2016 04:56 03/06/2016 04:28  Alkaline Phosphatase Latest Ref Range: 38 - 126 U/L 145 (H) 127 (H)  AST Latest Ref Range: 15 - 41 U/L 170 (H) 99 (H)  ALT Latest Ref Range: 14 - 54 U/L 464 (H) 373 (H)  Total Bilirubin Latest Ref Range: 0.3 - 1.2 mg/dL 3.4 (H) 1.6 (H)   Dg Ercp Biliary & Pancreatic Ducts  Result Date: 03/05/2016 CLINICAL DATA:  25 year old female undergoing sphincterotomy for stone removal EXAM: ERCP TECHNIQUE: Multiple spot images obtained with the fluoroscopic device and submitted  for interpretation post-procedure. FLUOROSCOPY TIME:  Fluoroscopy Time:  6 minutes 31 seconds Radiation Exposure Index (if provided by the fluoroscopic device): 102.26 mGy COMPARISON:  Intraoperative cholangiogram 03/04/2016 FINDINGS: A total of 4 intraoperative spot images demonstrate a flexible endoscope in the duodenum with cannulation of the common bile duct and contrast opacification. There is a rounded filling defect in the distal common duct consistent with choledocholithiasis. Subsequent images demonstrate sphincterotomy and balloon sweep of the common duct. IMPRESSION: 1. Choledocholithiasis with a small stone in the distal common bile duct. 2. Sphincterotomy and balloon sweep of the common duct. These images were submitted for radiologic interpretation only. Please see the procedural report for the amount of contrast and the fluoroscopy time utilized. Electronically Signed   By: Malachy MoanHeath  McCullough M.D.   On: 03/05/2016 14:56    Assessment / Plan:  25 year old female with cholelithiasis, s/p cholecystectomy with IOC revealing stone in CBD. She is s/p ERCP with sphincterotomy and stone extraction yesterday by Dr. Madilyn FiremanHayes. LFTs improved overnight. Friend helps with translation. Patient complains of diffuse abdominal pain, feels like when admitted to the hospital. Didn't eat her lunch. Getting frequent Dilaudid. Vitals stable. -check lipase, cbc -increase IVF to 12025ml/ hr for now.    LOS: 3 days   Willette Clusteraula Guenther  03/06/2016, 1:53 PM  Pager number [phone]  GI ATTENDING  Please see my comments on addendum  Wilhemina BonitoJohn N. Eda KeysPerry, Jr., M.D. [facility] Division of Gastroenterology

## 2016-03-06 NOTE — Progress Notes (Signed)
PROGRESS NOTE    Chelsey NevinBelkis Kemp  WUJ:[id number] DOB: 1990-07-03 DOA: 03/02/2016 PCP: Chelsey Kemp   Brief Narrative: 25 y.o.femalewithno significant past medical historywho presents with 3 weeks right upper quadrant abdominal pain.  Spanish translator used: Chelsey Kemp  Assessment & Plan:   #  Choledocholithiasis with obstruction: -s/p cholecystectomy by surgery team on 11/25. -s/p ERCP with sphincterotomy and stone extraction  11/26 by GI team. -Still having abdominal pain. Checking CBC, lipase by GI. Continue supportive care.  -Currently on liquid diet, IVF -Currently on empiric antibiotics, ceftriaxone by GI.  #  Non-intractable vomiting with nausea associated with abdomen pain: checking lipase. GI and surgery following.  -on IV dilaudid   #  Elevated LFTs: due to Choledocholithiasis: Liver enzymes are trending down. Continue to monitor.    DVT prophylaxis:SCD Code Status:Full Family Communication: Kemp's mother at bedside. Disposition Plan: Likely discharge home in 1-2 days.  Consultants:   GI and general surgery  Procedures: ERCP Antimicrobials: Ceftriaxone  Subjective: Kemp was seen and examined at bedside. Kemp reported epigastric abdominal pain, intermittent associated with nausea. Denied vomiting. Chelsey chills, headache, chest pain or shortness of breath. Kemp's mother at bedside. I used visual spanish interpreter.   Objective: Vitals:   03/05/16 1410 03/05/16 1440 03/05/16 2100 03/06/16 0518  BP:  130/87 (!) 104/54 (!) 99/54  Pulse: 70 77 (!) 52 (!) 54  Resp: 17  16 14   Temp:  97.8 F (36.6 C) 98.6 F (37 C) 98.5 F (36.9 C)  TempSrc:  Oral Oral Oral  SpO2: 99% 100% 100% 100%  Weight:      Height:        Intake/Output Summary (Last 24 hours) at 03/06/16 1450 Last data filed at 03/06/16 1100  Gross per 24 hour  Intake             1610 ml  Output             3000 ml  Net            -1390 ml   Filed Weights   03/04/16 0715  03/05/16 1156  Weight: 76.7 kg (169 lb) 76.7 kg (169 lb)    Examination:  General exam: Appears calm and comfortable  Respiratory system: Clear to auscultation. Respiratory effort normal. Chelsey wheezing or crackle Cardiovascular system: S1 & S2 heard, RRR.  Chelsey pedal edema. Gastrointestinal system: Abdomen is soft, diffuse tender, not distended. BS+ Central nervous system: Alert and oriented. Chelsey focal neurological deficits. Extremities: Symmetric 5 x 5 power. Skin: Chelsey rashes, lesions or ulcers Psychiatry: Judgement and insight appear normal. Mood & affect appropriate.     Data Reviewed: I have personally reviewed following labs and imaging studies  CBC:  Recent Labs Lab 03/02/16 1504 03/03/16 0503 03/05/16 0456  WBC 5.0 4.0 10.8*  NEUTROABS 3.6  --   --   HGB 13.6 11.8* 12.1  HCT 40.1 35.0* 35.9*  MCV 89.3 90.0 89.3  PLT 221 184 191   Basic Metabolic Panel:  Recent Labs Lab 03/02/16 1504 03/03/16 0503 03/05/16 0456 03/06/16 0428  NA 139 138 137 136  K 4.1 4.0 3.9 3.9  CL 107 111 105 103  CO2 27 24 25 27   GLUCOSE 115* 94 99 125*  BUN 13 9 8 9   CREATININE 0.71 0.50 0.58 0.60  CALCIUM 9.5 8.3* 9.4 9.0   GFR: Estimated Creatinine Clearance: 99.3 mL/min (by C-G formula based on SCr of 0.6 mg/dL). Liver Function Tests:  Recent Labs Lab  03/02/16 1504 03/03/16 0503 03/05/16 0456 03/06/16 0428  AST 1,372* 410* 170* 99*  ALT 880* 665* 464* 373*  ALKPHOS 119 120 145* 127*  BILITOT 1.1 1.6* 3.4* 1.6*  PROT 7.7 5.8* 6.8 6.5  ALBUMIN 4.9 3.4* 3.8 3.9    Recent Labs Lab 03/02/16 1900  LIPASE 23   Chelsey results for input(s): AMMONIA in the last 168 hours. Coagulation Profile:  Recent Labs Lab 03/02/16 1900  INR 1.00   Cardiac Enzymes: Chelsey results for input(s): CKTOTAL, CKMB, CKMBINDEX, TROPONINI in the last 168 hours. BNP (last 3 results) Chelsey results for input(s): PROBNP in the last 8760 hours. HbA1C: Chelsey results for input(s): HGBA1C in the last 72  hours. CBG: Chelsey results for input(s): GLUCAP in the last 168 hours. Lipid Profile: Chelsey results for input(s): CHOL, HDL, LDLCALC, TRIG, CHOLHDL, LDLDIRECT in the last 72 hours. Thyroid Function Tests: Chelsey results for input(s): TSH, T4TOTAL, FREET4, T3FREE, THYROIDAB in the last 72 hours. Anemia Panel: Chelsey results for input(s): VITAMINB12, FOLATE, FERRITIN, TIBC, IRON, RETICCTPCT in the last 72 hours. Sepsis Labs: Chelsey results for input(s): PROCALCITON, LATICACIDVEN in the last 168 hours.  Recent Results (from the past 240 hour(s))  Urine culture     Status: Abnormal   Collection Time: 03/02/16  7:22 PM  Result Value Ref Range Status   Specimen Description URINE, RANDOM  Final   Special Requests NONE  Final   Culture MULTIPLE SPECIES PRESENT, SUGGEST RECOLLECTION (A)  Final   Report Status 03/04/2016 FINAL  Final  Surgical PCR screen     Status: None   Collection Time: 03/04/16  4:55 AM  Result Value Ref Range Status   MRSA, PCR NEGATIVE NEGATIVE Final   Staphylococcus aureus NEGATIVE NEGATIVE Final    Comment:        The Xpert SA Assay (FDA approved for NASAL specimens in patients over 24 years of age), is one component of a comprehensive surveillance program.  Test performance has been validated by [facility] for patients greater than or equal to 24 year old. It is not intended to diagnose infection nor to guide or monitor treatment.          Radiology Studies: Dg Ercp Biliary & Pancreatic Ducts  Result Date: 03/05/2016 CLINICAL DATA:  25 year old female undergoing sphincterotomy for stone removal EXAM: ERCP TECHNIQUE: Multiple spot images obtained with the fluoroscopic device and submitted for interpretation post-procedure. FLUOROSCOPY TIME:  Fluoroscopy Time:  6 minutes 31 seconds Radiation Exposure Index (if provided by the fluoroscopic device): 102.26 mGy COMPARISON:  Intraoperative cholangiogram 03/04/2016 FINDINGS: A total of 4 intraoperative spot images demonstrate a  flexible endoscope in the duodenum with cannulation of the common bile duct and contrast opacification. There is a rounded filling defect in the distal common duct consistent with choledocholithiasis. Subsequent images demonstrate sphincterotomy and balloon sweep of the common duct. IMPRESSION: 1. Choledocholithiasis with a small stone in the distal common bile duct. 2. Sphincterotomy and balloon sweep of the common duct. These images were submitted for radiologic interpretation only. Please see the procedural report for the amount of contrast and the fluoroscopy time utilized. Electronically Signed   By: Malachy MoanHeath  McCullough M.D.   On: 03/05/2016 14:56        Scheduled Meds: . cefTRIAXone (ROCEPHIN)  IV  1 g Intravenous Q24H  . Influenza vac split quadrivalent PF  0.5 mL Intramuscular Tomorrow-1000   Continuous Infusions: . sodium chloride 75 mL/hr at 03/06/16 0540     LOS: 3 days  Cherisse Carrell Jaynie Collins, MD [facility] Pager [phone]  If 7PM-7AM, please contact night-coverage [email or web address] Password [email or web address] 03/06/2016, 2:50 PM

## 2016-03-07 LAB — COMPREHENSIVE METABOLIC PANEL
ALBUMIN: 3.5 g/dL (ref 3.5–5.0)
ALK PHOS: 109 U/L (ref 38–126)
ALT: 264 U/L — ABNORMAL HIGH (ref 14–54)
ANION GAP: 6 (ref 5–15)
AST: 54 U/L — ABNORMAL HIGH (ref 15–41)
BILIRUBIN TOTAL: 0.9 mg/dL (ref 0.3–1.2)
BUN: 9 mg/dL (ref 6–20)
CALCIUM: 8.8 mg/dL — AB (ref 8.9–10.3)
CO2: 26 mmol/L (ref 22–32)
Chloride: 109 mmol/L (ref 101–111)
Creatinine, Ser: 0.58 mg/dL (ref 0.44–1.00)
GLUCOSE: 90 mg/dL (ref 65–99)
POTASSIUM: 3.8 mmol/L (ref 3.5–5.1)
Sodium: 141 mmol/L (ref 135–145)
TOTAL PROTEIN: 5.8 g/dL — AB (ref 6.5–8.1)

## 2016-03-07 LAB — DIFFERENTIAL
BASOS ABS: 0 10*3/uL (ref 0.0–0.1)
BASOS PCT: 0 %
EOS ABS: 0 10*3/uL (ref 0.0–0.7)
Eosinophils Relative: 0 %
Lymphocytes Relative: 46 %
Lymphs Abs: 2.7 10*3/uL (ref 0.7–4.0)
MONOS PCT: 7 %
Monocytes Absolute: 0.4 10*3/uL (ref 0.1–1.0)
NEUTROS ABS: 2.7 10*3/uL (ref 1.7–7.7)
NEUTROS PCT: 47 %

## 2016-03-07 LAB — CBC
HEMATOCRIT: 33.5 % — AB (ref 36.0–46.0)
HEMOGLOBIN: 11.1 g/dL — AB (ref 12.0–15.0)
MCH: 30.2 pg (ref 26.0–34.0)
MCHC: 33.1 g/dL (ref 30.0–36.0)
MCV: 91.3 fL (ref 78.0–100.0)
Platelets: 166 10*3/uL (ref 150–400)
RBC: 3.67 MIL/uL — ABNORMAL LOW (ref 3.87–5.11)
RDW: 13.9 % (ref 11.5–15.5)
WBC: 6.1 10*3/uL (ref 4.0–10.5)

## 2016-03-07 LAB — LIPASE, BLOOD: LIPASE: 19 U/L (ref 11–51)

## 2016-03-07 MED ORDER — ALUM & MAG HYDROXIDE-SIMETH 200-200-20 MG/5ML PO SUSP: 15.0000 mL | ORAL | Status: DC | PRN

## 2016-03-07 MED ORDER — ACETAMINOPHEN 325 MG PO TABS: 650.0000 mg | ORAL_TABLET | Freq: Four times a day (QID) | ORAL | Status: AC | PRN

## 2016-03-07 MED ORDER — OXYCODONE HCL 5 MG PO TABS: 5.0000 mg | ORAL_TABLET | ORAL | 0 refills | Status: DC | PRN

## 2016-03-07 MED ORDER — POTASSIUM CHLORIDE IN NACL 40-0.9 MEQ/L-% IV SOLN
INTRAVENOUS | Status: DC
  Administered 2016-03-07 – 2016-03-08 (×2): 75 mL/h via INTRAVENOUS
  Filled 2016-03-07 (×3): qty 1000

## 2016-03-07 MED ORDER — IBUPROFEN 200 MG PO TABS: 400.0000 mg | ORAL_TABLET | Freq: Four times a day (QID) | ORAL | Status: DC | PRN

## 2016-03-07 NOTE — Progress Notes (Signed)
Paged on call for [facility] about patient pain level and how patient "cannot feel her legs." Patient walking to the bathroom / moving legs in bed. Vital signs stable. No new orders received in the callback.

## 2016-03-07 NOTE — Progress Notes (Signed)
PROGRESS NOTE    Chelsey NevinBelkis Kemp  ZOX:[id number] DOB: October 26, 1990 DOA: 03/02/2016 PCP: No PCP Per Patient   Brief Narrative: 25 y.o.femalewithno significant past medical historywho presents with 3 weeks right upper quadrant abdominal pain.  Engineer, structuralpanish translator used.  Assessment & Plan:   #  Choledocholithiasis with obstruction: -s/p cholecystectomy by surgery team on 11/25. -s/p ERCP with sphincterotomy and stone extraction on 11/26 by GI team. -had severe abdomen pain, labs showed elevated lipase level likely post procedural pancreatitis. Lipase level trended down.  -pt feels intermittent epigatric pain but mildly better than yesterday.  -we will advance diet today, continue IVF.  -off abx.  # Hypotension: BP dropped to 70s overnight. Around 90s now likely contributed by pain medication. Dc IV dilaudid today to see how patient does without IV pain medication. Monitor BP.  #  Non-intractable vomiting with nausea associated with abdomen pain:  Still has some nausea and reported one episode of vomiting this morning.   #  Elevated LFTs: due to Choledocholithiasis: Liver enzymes are trending down. Continue to monitor.    DVT prophylaxis:SCD Code Status:Full Family Communication: Patient's mother at bedside. Disposition Plan: Likely discharge home in 1-2 days if able to tolerate diet, pain controlled and BP improves.   Consultants:   GI and general surgery  Procedures: ERCP Antimicrobials: Ceftriaxone  Subjective: Patient was seen and examined at bedside. Used Engineer, structuralspanish translator. Patient reported nausea and had one episode of vomiting this morning. Feels weak. Has intermittent abdominal pain, currently better after receiving pain medication. Reports headache but denied dizziness or lightheadedness. Patient's mother at bedside.  Objective: Vitals:   03/06/16 2150 03/07/16 0225 03/07/16 0536 03/07/16 0615  BP: (!) 103/57 (!) 125/91 (!) 78/38 (!) 97/55  Pulse: (!) 112 63 (!)  54 (!) 53  Resp: 16 18 15    Temp: 98.6 F (37 C)  97.9 F (36.6 C)   TempSrc: Oral  Axillary   SpO2: 98% 100% 98%   Weight:      Height:        Intake/Output Summary (Last 24 hours) at 03/07/16 1329 Last data filed at 03/07/16 1000  Gross per 24 hour  Intake          2163.33 ml  Output             1200 ml  Net           963.33 ml   Filed Weights   03/04/16 0715 03/05/16 1156  Weight: 76.7 kg (169 lb) 76.7 kg (169 lb)    Examination:  General exam: Not in distress, lying on bed comfortable  Respiratory system: Clear bilateral, no wheezing or crackle Cardiovascular system: S1 & S2 heard, RRR.  No pedal edema. Gastrointestinal system: Abdomen soft, has diffuse tenderness, nondistended. Surgical site clean Central nervous system: Alert and oriented. No focal neurological deficits. Extremities: Symmetric 5 x 5 power. Skin: No rashes, lesions or ulcers    Data Reviewed: I have personally reviewed following labs and imaging studies  CBC:  Recent Labs Lab 03/02/16 1504 03/03/16 0503 03/05/16 0456 03/06/16 1512 03/07/16 0440  WBC 5.0 4.0 10.8* 9.6 6.1  NEUTROABS 3.6  --   --   --  2.7  HGB 13.6 11.8* 12.1 11.8* 11.1*  HCT 40.1 35.0* 35.9* 35.2* 33.5*  MCV 89.3 90.0 89.3 89.8 91.3  PLT 221 184 191 185 166   Basic Metabolic Panel:  Recent Labs Lab 03/02/16 1504 03/03/16 0503 03/05/16 0456 03/06/16 0428 03/07/16 0440  NA 139  138 137 136 141  K 4.1 4.0 3.9 3.9 3.8  CL 107 111 105 103 109  CO2 27 24 25 27 26   GLUCOSE 115* 94 99 125* 90  BUN 13 9 8 9 9   CREATININE 0.71 0.50 0.58 0.60 0.58  CALCIUM 9.5 8.3* 9.4 9.0 8.8*   GFR: Estimated Creatinine Clearance: 99.3 mL/min (by C-G formula based on SCr of 0.58 mg/dL). Liver Function Tests:  Recent Labs Lab 03/02/16 1504 03/03/16 0503 03/05/16 0456 03/06/16 0428 03/07/16 0440  AST 1,372* 410* 170* 99* 54*  ALT 880* 665* 464* 373* 264*  ALKPHOS 119 120 145* 127* 109  BILITOT 1.1 1.6* 3.4* 1.6* 0.9  PROT  7.7 5.8* 6.8 6.5 5.8*  ALBUMIN 4.9 3.4* 3.8 3.9 3.5    Recent Labs Lab 03/02/16 1900 03/06/16 1512 03/07/16 0440  LIPASE 23 101* 19   No results for input(s): AMMONIA in the last 168 hours. Coagulation Profile:  Recent Labs Lab 03/02/16 1900  INR 1.00   Cardiac Enzymes: No results for input(s): CKTOTAL, CKMB, CKMBINDEX, TROPONINI in the last 168 hours. BNP (last 3 results) No results for input(s): PROBNP in the last 8760 hours. HbA1C: No results for input(s): HGBA1C in the last 72 hours. CBG: No results for input(s): GLUCAP in the last 168 hours. Lipid Profile: No results for input(s): CHOL, HDL, LDLCALC, TRIG, CHOLHDL, LDLDIRECT in the last 72 hours. Thyroid Function Tests: No results for input(s): TSH, T4TOTAL, FREET4, T3FREE, THYROIDAB in the last 72 hours. Anemia Panel: No results for input(s): VITAMINB12, FOLATE, FERRITIN, TIBC, IRON, RETICCTPCT in the last 72 hours. Sepsis Labs: No results for input(s): PROCALCITON, LATICACIDVEN in the last 168 hours.  Recent Results (from the past 240 hour(s))  Urine culture     Status: Abnormal   Collection Time: 03/02/16  7:22 PM  Result Value Ref Range Status   Specimen Description URINE, RANDOM  Final   Special Requests NONE  Final   Culture MULTIPLE SPECIES PRESENT, SUGGEST RECOLLECTION (A)  Final   Report Status 03/04/2016 FINAL  Final  Surgical PCR screen     Status: None   Collection Time: 03/04/16  4:55 AM  Result Value Ref Range Status   MRSA, PCR NEGATIVE NEGATIVE Final   Staphylococcus aureus NEGATIVE NEGATIVE Final    Comment:        The Xpert SA Assay (FDA approved for NASAL specimens in patients over 24 years of age), is one component of a comprehensive surveillance program.  Test performance has been validated by [facility] for patients greater than or equal to 24 year old. It is not intended to diagnose infection nor to guide or monitor treatment.          Radiology Studies: No results  found.      Scheduled Meds: . Influenza vac split quadrivalent PF  0.5 mL Intramuscular Tomorrow-1000   Continuous Infusions: . 0.9 % NaCl with KCl 40 mEq / L       LOS: 4 days    Dron Jaynie CollinsPrasad Bhandari, MD [facility] Pager [phone]  If 7PM-7AM, please contact night-coverage [email or web address] Password [email or web address] 03/07/2016, 1:29 PM

## 2016-03-07 NOTE — Progress Notes (Signed)
     [facility] Progress Note  Chief Complaint:    Abdominal pain  / elevated LFTs / bile duct stone  Subjective: " a little pain " . Mainly has a headache. Has been up to bathroom.   Objective:  Vital signs in last 24 hours: Temp:  [97.9 F (36.6 C)-98.6 F (37 C)] 97.9 F (36.6 C) (11/28 0536) Pulse Rate:  [53-112] 53 (11/28 0615) Resp:  [15-18] 15 (11/28 0536) BP: (78-125)/(38-91) 97/55 (11/28 0615) SpO2:  [98 %-100 %] 98 % (11/28 0536) Last BM Date: 02/28/16 General:   Alert, well-developed, female in NAD EENT:  Normal hearing, non icteric sclera, conjunctive pink.  Heart:  Regular rate and rhythm; no murmurs. no lower extremity edema .Abdomen:  Soft, nondistended, moderate RUQ / epigastric tenderness.  A few bowel sounds but overall still hypoactive.    Neurologic:  Alert and  oriented x4;  grossly normal neurologically. Psych:  Alert and cooperative. Normal mood and affect. Skin:   Intake/Output from previous day: 11/27 0701 - 11/28 0700 In: 2463.3 [I.V.:2413.3; IV Piggyback:50] Out: 300 [Urine:300] Intake/Output this shift: No intake/output data recorded.  Lab Results:  Recent Labs  03/05/16 0456 03/06/16 1512 03/07/16 0440  WBC 10.8* 9.6 6.1  HGB 12.1 11.8* 11.1*  HCT 35.9* 35.2* 33.5*  PLT 191 185 166   BMET  Recent Labs  03/05/16 0456 03/06/16 0428 03/07/16 0440  NA 137 136 141  K 3.9 3.9 3.8  CL 105 103 109  CO2 25 27 26   GLUCOSE 99 125* 90  BUN 8 9 9   CREATININE 0.58 0.60 0.58  CALCIUM 9.4 9.0 8.8*   LFT  Recent Labs  03/07/16 0440  PROT 5.8*  ALBUMIN 3.5  AST 54*  ALT 264*  ALKPHOS 109  BILITOT 0.9    Assessment / Plan:  Cholelithiasis, s/p cholecystectomy followed by ERCP with sphincterotomy and stone extraction two days ago. She looks and feels a little better today. LFTs continue to improve. White count normal, lipase normal today (101 yesterday). Episode of hypotension during the night but could be from  narcotics.  Minimal flatus, nurse to ambulate her. Maybe home today unless Surgery feels she needs imaging.  Headache. Probably combination of decreased PO intake and narcotics. I asked RN to give Tylenol., try and avoid narcotics.    LOS: 4 days  Willette Clusteraula Guenther  03/07/2016, 9:38 AM Pager number [phone]  GI ATTENDING  Interval history data reviewed. Patient personally seen and examined. Agree with interval progress note. Patient looks better today. She denies pain. Mild discomfort in the upper mid abdomen to palpation. Normal white blood cell count and lipase. Improving LFTs. No further plans or recommendations from GI standpoint. Imagine that she will go home soon. Please call us if further help needed. Discussed with Dr. Johna SheriffHoxworth. Thank you\  Wilhemina BonitoJohn N. Eda KeysPerry, Jr., M.D. [facility] Division of Gastroenterology

## 2016-03-07 NOTE — Progress Notes (Signed)
Paged MD about patient BP 78/38 pulse 54 other VSS. Received verbal orders to add a differential to the CBC drawn this am. Patient was up and walking / stable prior to BP obtained.  Will cont to monitor.

## 2016-03-07 NOTE — Progress Notes (Signed)
Paged Will Marlyne BeardsJennings x 2 to clarify recent order to dc pt. Dr. Ronalee BeltsBhandari aware via phone of recent order and pt status. MD did not wish for pt to discharge this evening. Mobilized x 1 today and refuses to walk now. SCD's applied bilaterally to LE's. VSS. Eating little. Drinking some clears. No new orders received at this time.

## 2016-03-07 NOTE — Discharge Instructions (Signed)
Colecistectoma laparoscpica, cuidados posteriores (Laparoscopic Cholecystectomy, Care After) Siga estas instrucciones durante las prximas semanas. Estas indicaciones le proporcionan informacin acerca de cmo deber cuidarse despus del procedimiento. El mdico tambin podr darle instrucciones ms especficas. El tratamiento ha sido planificado segn las prcticas mdicas actuales, pero en algunos casos pueden ocurrir problemas. Comunquese con el mdico si tiene algn problema o dudas despus del procedimiento. QU ESPERAR DESPUS DEL PROCEDIMIENTO Despus del procedimiento, es comn tener los siguientes sntomas:  Dolor en los lugares de las incisiones. Le darn analgsicos para Human resources officercontrolar el dolor.  Nuseas o vmitos leves. Estos sntomas deberan mejorar despus de las primeras 24horas.  Meteorismo y Designer, fashion/clothingposiblemente dolor en el hombro debido al gas que se us durante el procedimiento. Estos sntomas mejorarn despus de las primeras 24horas. INSTRUCCIONES PARA EL CUIDADO EN EL HOGAR Cuidado de la incisin   Siga las indicaciones del mdico acerca del cuidado de las incisiones. Haga lo siguiente:  BorgWarnerLvese las manos con agua y jabn antes de Multimedia programmercambiar las vendas (vendaje). Use desinfectante para manos si no dispone de [place] y [place]n.  Cambie el vendaje como se lo haya indicado el mdico.  No retire los puntos (suturas), el QUALCOMMadhesivo para la piel o las tiras Clearbrookadhesivas. Es posible que estos deban quedar puestos en la piel durante 2semanas o ms tiempo. Si los bordes de las tiras [address]ad[address]ivas empiezan a despegarse y Scientific laboratory technicianenroscarse, puede recortar los que estn sueltos. No retire las tiras Agilent Technologiesadhesivas por completo a menos que el mdico se lo indique.  No tome baos de inmersin, no nade ni use el jacuzzi hasta que el mdico lo autorice. Pregntele al mdico si puede ducharse. Delle Reiningal vez solo le permitan tomar baos de [place]esponja. Instrucciones generales   Baxter Internationalome los medicamentos de venta libre y los recetados  solamente como se lo haya indicado el mdico.  No conduzca ni opere maquinaria pesada mientras toma analgsicos recetados.  Reanude su dieta normal como se lo haya indicado el mdico.  No levante ningn objeto que pese ms de 10libras (4,5kg).  No practique deportes de contacto durante unasemana o hasta que el mdico lo autorice. SOLICITE ATENCIN MDICA SI:   Tiene enrojecimiento, hinchazn o Art therapistdolor en el lugar de la incisin.  Observa lquido, sangre o pus que emanan de la incisin.  Percibe que sale mal olor de la zona de las incisiones.  Las incisiones quirrgicas se abren.  Tiene fiebre. SOLICITE ATENCIN MDICA DE INMEDIATO SI:  Le aparece una erupcin cutnea.  Tiene dificultad para respirar.  Siente dolor en el pecho.  Siente ms dolor en los hombros (en las zonas donde van los breteles).  Se desmaya o tiene episodios de Barnes & Noblemareos mientras est de pie.  Siente un dolor intenso en el abdomen.  Tiene nuseas o vmitos durante ms de Civil engineer, contractingun da. Esta informacin no tiene Theme park managercomo fin reemplazar el consejo del mdico. Asegrese de hacerle al mdico cualquier pregunta que tenga. Document Released: 11/07/2010 Document Revised: 12/16/2014 Elsevier Interactive Patient Education  2017 Elsevier Inc.   Colangiopancreatografa retrgrada endoscpica (CPRE) Cuidados posteriores (Endoscopic Retrograde Cholangiopancreatography (ERCP), Care After) Siga estas instrucciones durante las prximas semanas. Estas indicaciones le proporcionan informacin general acerca de cmo deber cuidarse despus del procedimiento. El mdico tambin podr darle instrucciones ms especficas. El tratamiento se ha planificado de acuerdo a las prcticas mdicas actuales, pero a veces se producen problemas. Comunquese con el mdico si tiene algn problema o tiene dudas despus del procedimiento. QU ESPERAR DESPUS DEL PROCEDIMIENTO Despus del procedimiento, es tpico tener  las siguientes sensaciones:  Chief Financial Officer.  Ganas de vomitar (nuseas).  Hinchazn.  Mareos.  Cansancio. INSTRUCCIONES PARA EL CUIDADO EN EL HOGAR  Pdale a algn amigo o familiar que se quede con usted durante las primeras 24 horas despus del procedimiento.  Comience a tomar sus medicamentos habituales y a comer normalmente tan pronto como se sienta lo suficientemente bien, o segn las indicaciones de su mdico. SOLICITE ATENCIN MDICA SI:  Siente dolor abdominal.  Tiene signos de infeccin como:  Escalofros.  No se siente bien. SOLICITE ATENCIN MDICA DE INMEDIATO SI:  Tiene dificultad para tragar.  Siente un dolor abdominal, en la garganta o en el pecho cada vez ms intenso.  Vomita.  La materia fecal es negra o tiene [place] [address] [place].  Tiene fiebre. Esta informacin no tiene Theme park manager el consejo del mdico. Asegrese de hacerle al mdico cualquier pregunta que tenga. Document Released: 01/15/2013 Document Revised: 01/15/2013 Document Reviewed: 09/30/2012 Elsevier Interactive Patient Education  2017 Elsevier Inc.  CCS ______[facility] LAPAROSCOPIC SURGERY: POST OP INSTRUCTIONS Always review your discharge instruction sheet given to you by the facility where your surgery was performed. IF YOU HAVE DISABILITY OR FAMILY LEAVE FORMS, YOU MUST BRING THEM TO THE OFFICE FOR PROCESSING.   DO NOT GIVE THEM TO YOUR DOCTOR.  1. A prescription for pain medication may be given to you upon discharge.  Take your pain medication as prescribed, if needed.  If narcotic pain medicine is not needed, then you may take acetaminophen (Tylenol) or ibuprofen (Advil) as needed. 2. Take your usually prescribed medications unless otherwise directed. 3. If you need a refill on your pain medication, please contact your pharmacy.  They will contact our office to request authorization. Prescriptions will not be filled after 5pm or on week-ends. 4. You should follow a light diet the first few days  after arrival home, such as soup and crackers, etc.  Be sure to include lots of fluids daily. 5. Most patients will experience some swelling and bruising in the area of the incisions.  Ice packs will help.  Swelling and bruising can take several days to resolve.  6. It is common to experience some constipation if taking pain medication after surgery.  Increasing fluid intake and taking a stool softener (such as Colace) will usually help or prevent this problem from occurring.  A mild laxative (Milk of Magnesia or Miralax) should be taken according to package instructions if there are no bowel movements after 48 hours. 7. Unless discharge instructions indicate otherwise, you may remove your bandages 24-48 hours after surgery, and you may shower at that time.  You may have steri-strips (small skin tapes) in place directly over the incision.  These strips should be left on the skin for 7-10 days.  If your surgeon used skin glue on the incision, you may shower in 24 hours.  The glue will flake off over the next 2-3 weeks.  Any sutures or staples will be removed at the office during your follow-up visit. 8. ACTIVITIES:  You may resume regular (light) daily activities beginning the next day--such as daily self-care, walking, climbing stairs--gradually increasing activities as tolerated.  You may have sexual intercourse when it is comfortable.  Refrain from any heavy lifting or straining until approved by your doctor. a. You may drive when you are no longer taking prescription pain medication, you can comfortably wear a seatbelt, and you can safely maneuver your car and apply brakes. b. RETURN TO  WORK:  __________________________________________________________ 9. You should see your doctor in the office for a follow-up appointment approximately 2-3 weeks after your surgery.  Make sure that you call for this appointment within a day or two after you arrive home to insure a convenient appointment time. 10. OTHER  INSTRUCTIONS: __________________________________________________________________________________________________________________________ __________________________________________________________________________________________________________________________ WHEN TO CALL YOUR DOCTOR: 1. Fever over 101.0 2. Inability to urinate 3. Continued bleeding from incision. 4. Increased pain, redness, or drainage from the incision. 5. Increasing abdominal pain  The clinic staff is available to answer your questions during regular business hours.  Please dont hesitate to call and ask to speak to one of the nurses for clinical concerns.  If you have a medical emergency, go to the nearest emergency room or call 911.  [facility] is always on call at the hospital. [address], [address], [place], [place]  [address] ? [address] [address], [place], [place]   [id number] [phone] ? [phone] ? FAX [phone] Web site: [email or web address]

## 2016-03-07 NOTE — Progress Notes (Signed)
2 Days Post-Op  Subjective: Feels better, still having some pain.  Has walked and is tolerating clears well.  Pain is mid upper epigastric area.   Objective: Vital signs in last 24 hours: Temp:  [97.9 F (36.6 C)-98.6 F (37 C)] 97.9 F (36.6 C) (11/28 0536) Pulse Rate:  [53-112] 53 (11/28 0615) Resp:  [15-18] 15 (11/28 0536) BP: (78-125)/(38-91) 97/55 (11/28 0615) SpO2:  [98 %-100 %] 98 % (11/28 0536) Last BM Date: 02/28/16 2400 IV Urine 900 Afebrile, BP down 5 Am Lipase down to 19 this AM, WBC is normal, LFT's improving  Intake/Output from previous day: 11/27 0701 - 11/28 0700 In: 2463.3 [I.V.:2413.3; IV Piggyback:50] Out: 900 [Urine:900] Intake/Output this shift: Total I/O In: -  Out: 600 [Urine:600]  General appearance: alert, cooperative and no distress Resp: clear to auscultation bilaterally GI: soft, sore, sites all look good, pain mid upper epigastric sites  Lab Results:   Recent Labs  03/06/16 1512 03/07/16 0440  WBC 9.6 6.1  HGB 11.8* 11.1*  HCT 35.2* 33.5*  PLT 185 166    BMET  Recent Labs  03/06/16 0428 03/07/16 0440  NA 136 141  K 3.9 3.8  CL 103 109  CO2 27 26  GLUCOSE 125* 90  BUN 9 9  CREATININE 0.60 0.58  CALCIUM 9.0 8.8*   PT/INR No results for input(s): LABPROT, INR in the last 72 hours.   Recent Labs Lab 03/02/16 1504 03/03/16 0503 03/05/16 0456 03/06/16 0428 03/07/16 0440  AST 1,372* 410* 170* 99* 54*  ALT 880* 665* 464* 373* 264*  ALKPHOS 119 120 145* 127* 109  BILITOT 1.1 1.6* 3.4* 1.6* 0.9  PROT 7.7 5.8* 6.8 6.5 5.8*  ALBUMIN 4.9 3.4* 3.8 3.9 3.5     Lipase     Component Value Date/Time   LIPASE 19 03/07/2016 0440     Studies/Results: Dg Ercp Biliary & Pancreatic Ducts  Result Date: 03/05/2016 CLINICAL DATA:  25 year old female undergoing sphincterotomy for stone removal EXAM: ERCP TECHNIQUE: Multiple spot images obtained with the fluoroscopic device and submitted for interpretation post-procedure.  FLUOROSCOPY TIME:  Fluoroscopy Time:  6 minutes 31 seconds Radiation Exposure Index (if provided by the fluoroscopic device): 102.26 mGy COMPARISON:  Intraoperative cholangiogram 03/04/2016 FINDINGS: A total of 4 intraoperative spot images demonstrate a flexible endoscope in the duodenum with cannulation of the common bile duct and contrast opacification. There is a rounded filling defect in the distal common duct consistent with choledocholithiasis. Subsequent images demonstrate sphincterotomy and balloon sweep of the common duct. IMPRESSION: 1. Choledocholithiasis with a small stone in the distal common bile duct. 2. Sphincterotomy and balloon sweep of the common duct. These images were submitted for radiologic interpretation only. Please see the procedural report for the amount of contrast and the fluoroscopy time utilized. Electronically Signed   By: Malachy MoanHeath  McCullough M.D.   On: 03/05/2016 14:56   Prior to Admission medications   Medication Sig Start Date End Date Taking? Authorizing Provider  famotidine (PEPCID) 20 MG tablet Take 1 tablet (20 mg total) by mouth 2 (two) times daily. 02/18/16  Yes Lorre NickAnthony Allen, MD     Medications: . cefTRIAXone (ROCEPHIN)  IV  1 g Intravenous Q24H  . Influenza vac split quadrivalent PF  0.5 mL Intramuscular Tomorrow-1000   . sodium chloride 125 mL/hr at 03/07/16 0600    Assessment/Plan Acute cholecystitis s/p lap cholecystectomy 11/25 Dr. Andrey CampanileWilson -- VS are reassuring. Differential includes pancreatitis, bile leak, early abscess. Await today's labs, check CBC and  CMET in AM. Choledocholithiasis with obstruction s/p ERCP 11/26 -- GI advanced fluids and is checking for pancreatitis FEN: IV fluids/clears ID: Ceftriaxone day 4 DVT:  None -ambulation order  Plan:  Advance diet mobilize more and home later if no issues.    LOS: 4 days    Landrum Carbonell 03/07/2016 [phone]

## 2016-03-07 NOTE — Progress Notes (Signed)
Chelsey Kemp called back and spoke to charge nurse. PA aware of Dr. Dorena CookeyBhandari's plan and took order out for dc today. No new orders received.

## 2016-03-08 DIAGNOSIS — K8043 Calculus of bile duct with acute cholecystitis with obstruction: Secondary | ICD-10-CM

## 2016-03-08 LAB — CBC WITH DIFFERENTIAL/PLATELET
BASOS ABS: 0 10*3/uL (ref 0.0–0.1)
BASOS PCT: 0 %
EOS ABS: 0 10*3/uL (ref 0.0–0.7)
EOS PCT: 1 %
HCT: 39.1 % (ref 36.0–46.0)
Hemoglobin: 13 g/dL (ref 12.0–15.0)
LYMPHS PCT: 27 %
Lymphs Abs: 1.9 10*3/uL (ref 0.7–4.0)
MCH: 30.3 pg (ref 26.0–34.0)
MCHC: 33.2 g/dL (ref 30.0–36.0)
MCV: 91.1 fL (ref 78.0–100.0)
Monocytes Absolute: 0.5 10*3/uL (ref 0.1–1.0)
Monocytes Relative: 6 %
Neutro Abs: 4.8 10*3/uL (ref 1.7–7.7)
Neutrophils Relative %: 66 %
PLATELETS: 189 10*3/uL (ref 150–400)
RBC: 4.29 MIL/uL (ref 3.87–5.11)
RDW: 13.8 % (ref 11.5–15.5)
WBC: 7.2 10*3/uL (ref 4.0–10.5)

## 2016-03-08 LAB — COMPREHENSIVE METABOLIC PANEL
ALBUMIN: 3.9 g/dL (ref 3.5–5.0)
ALT: 234 U/L — AB (ref 14–54)
AST: 55 U/L — AB (ref 15–41)
Alkaline Phosphatase: 107 U/L (ref 38–126)
Anion gap: 6 (ref 5–15)
BUN: 5 mg/dL — AB (ref 6–20)
CHLORIDE: 104 mmol/L (ref 101–111)
CO2: 27 mmol/L (ref 22–32)
CREATININE: 0.79 mg/dL (ref 0.44–1.00)
Calcium: 9.5 mg/dL (ref 8.9–10.3)
GFR calc Af Amer: >60 mL/min (ref >60)
GFR calc non Af Amer: >60 mL/min (ref >60)
Glucose, Bld: 93 mg/dL (ref 65–99)
POTASSIUM: 4.1 mmol/L (ref 3.5–5.1)
SODIUM: 137 mmol/L (ref 135–145)
Total Bilirubin: 1 mg/dL (ref 0.3–1.2)
Total Protein: 6.9 g/dL (ref 6.5–8.1)

## 2016-03-08 LAB — MAGNESIUM: Magnesium: 2 mg/dL (ref 1.7–2.4)

## 2016-03-08 LAB — LIPASE, BLOOD: Lipase: 16 U/L (ref 11–51)

## 2016-03-08 NOTE — Progress Notes (Signed)
Discharge instructions discussed with patient and family with use of Interpreter, all questions answered, verbalized agreement and understanding, prescription given to patient

## 2016-03-08 NOTE — Progress Notes (Signed)
3 Days Post-Op  Subjective: It took some time,but I found out there are at least 2 issues we were unaware of.  1 she didn't know to order lunch, supper or breakfast so it doesn't sound like she has eaten since yesterday, when she had some soup.  She is also having a headache with light intolerance and vomiting.   It took about 15 minutes to get that history with the interpreter, # [phone].  Mother finally mentioned the headaches and vomiting.  She says she feels OK from the surgery.  She has fluids going now and on SCD's. Currently she feels fine.  Abdominal pain is much improved.  Objective: Vital signs in last 24 hours: Temp:  [98.3 F (36.8 C)-99.6 F (37.6 C)] 98.3 F (36.8 C) (11/29 0500) Pulse Rate:  [55-88] 55 (11/29 0500) Resp:  [15-17] 15 (11/29 0500) BP: (92-132)/(54-77) 92/54 (11/29 0500) SpO2:  [94 %-100 %] 94 % (11/29 0500) Last BM Date: 02/28/16 825 IV  PO not recorded Urine 900 Afebrile, VSS   BP 90-100 range Labs this AM shows LFT's better CMP OK WBC 7.2, H/H is up.  Lipase is 16 Intake/Output from previous day: 11/28 0701 - 11/29 0700 In: 825 [I.V.:825] Out: 900 [Urine:900] Intake/Output this shift: No intake/output data recorded.  General appearance: alert, cooperative and no distress Resp: clear to auscultation bilaterally GI: soft sore, sites all look fine  Lab Results:   Recent Labs  03/06/16 1512 03/07/16 0440  WBC 9.6 6.1  HGB 11.8* 11.1*  HCT 35.2* 33.5*  PLT 185 166    BMET  Recent Labs  03/06/16 0428 03/07/16 0440  NA 136 141  K 3.9 3.8  CL 103 109  CO2 27 26  GLUCOSE 125* 90  BUN 9 9  CREATININE 0.60 0.58  CALCIUM 9.0 8.8*   PT/INR No results for input(s): LABPROT, INR in the last 72 hours.   Recent Labs Lab 03/02/16 1504 03/03/16 0503 03/05/16 0456 03/06/16 0428 03/07/16 0440  AST 1,372* 410* 170* 99* 54*  ALT 880* 665* 464* 373* 264*  ALKPHOS 119 120 145* 127* 109  BILITOT 1.1 1.6* 3.4* 1.6* 0.9  PROT 7.7 5.8* 6.8  6.5 5.8*  ALBUMIN 4.9 3.4* 3.8 3.9 3.5     Lipase     Component Value Date/Time   LIPASE 19 03/07/2016 0440     Studies/Results: No results found.  Medications: . Influenza vac split quadrivalent PF  0.5 mL Intramuscular Tomorrow-1000   . 0.9 % NaCl with KCl 40 mEq / L 75 mL/hr (03/08/16 [id number])    Assessment/Plan Acute cholecystitis s/p lap cholecystectomy 11/25 Dr. Andrey CampanileWilson-- VS are reassuring. Differential includes pancreatitis, bile leak, early abscess. Await today's labs, check CBC and CMET in AM. Choledocholithiasis with obstruction s/p ERCP 11/26-- GI advanced fluids and is checking for pancreatitis FEN: IV fluids/cardiac diet ID: Ceftriaxone day 3 completed 03/06/16 DVT:  SCD-ambulation order   Plan:  From our standpoint she can go home when OK with Medicine.  Info is in the AVS.    LOS: 5 days    Aubreana Cornacchia 03/08/2016 [phone]

## 2016-03-08 NOTE — Discharge Summary (Signed)
Discharge Summary  Chelsey Kemp ZOX:[id number] DOB: [birth year]  PCP: No PCP Per Patient  Admit date: 03/02/2016 Discharge date: 03/08/2016  Time spent: <49mins  Recommendations for Outpatient Follow-up:  1. F/u with general surgery for hospital discharge follow up, repeat cbc/cmp at follow up  Discharge Diagnoses:  Active Hospital Problems   Diagnosis Date Noted  . Choledocholithiasis with obstruction 03/04/2016  . Gall stones, common bile duct   . Gastritis   . Non-intractable vomiting with nausea   . Elevated LFTs   . Pain of upper abdomen   . Acute cholecystitis s/p lap cholecystectomy 03/04/2016 03/02/2016    Resolved Hospital Problems   Diagnosis Date Noted Date Resolved  . Abnormal CT of the abdomen  03/04/2016  . Hepatitis 03/02/2016 03/04/2016    Discharge Condition: stable  Diet recommendation: regular diet  Filed Weights   03/04/16 0715 03/05/16 1156  Weight: 76.7 kg (169 lb) 76.7 kg (169 lb)    History of present illness:  PCP: No PCP Per Patient   Patient coming from: Home  Chief Complaint: RUQ Abdominal pain  HPI: Chelsey Kemp is a 25 y.o. female with no significant past medical history who presents with 3 weeks right upper quadrant abdominal pain.  All history collected through telephonic video interpreter.  The patient was in her usual state of health until about one month ago when she started to develop discomfort in her right upper quadrant. This was initially moderate in intensity, and intermittent. She went to the emergency room where a right upper quadrant ultrasound was negative, LFTs were normal and she was discharged with sucralfate and antacid.  The pain persisted despite these treatments, and on the last 3 days, the pain has become intense, constant, worse with lying down, worse after eating, and associated with vomiting. She could no longer bear the pain so she came back to the ER.  ED course: -Afebrile, heart rate 60s,  respirations and pulse ox normal, blood pressure 114/55 -Na 139, K 4.1, Cr 0.71, WBC 5K, Hgb 13.6 -AST and ALT were 1372 and 880, respectively, total bilirubin and alkaline phosphatase normal. -CT of the abdomen and pelvis showed gallstones but no other abnormality -Right upper quadrant ultrasound showed normal CBD and no gallstones or evidence of cholecystitis -The case was discussed with gastroenterology who recommended admission for pain control and fluids, and TRH was asked to evaluate    The patient denies Tylenol use. She is from [place], has lived in the [place] last 3 years. She believes that she was vaccinated for hepatitis A and B. She has never had a tattoo, used injection drugs, or exchanged sex for money. She has had no previous gallstones, and no one of her family has had any history of liver disease or gallbladder disease.   Hospital Course:  Principal Problem:   Choledocholithiasis with obstruction Active Problems:   Acute cholecystitis s/p lap cholecystectomy 03/04/2016   Pain of upper abdomen   Gastritis   Non-intractable vomiting with nausea   Elevated LFTs   Gall stones, common bile duct  #  Choledocholithiasis with obstruction: -s/p cholecystectomy by surgery team on 11/25. -s/p ERCP with sphincterotomy and stone extraction on 11/26by GI team. -had severe abdomen pain, labs showed elevated lipase level likely post procedural pancreatitis. Lipase level trended down.  -pt feels intermittent epigatric pain but mildly better than yesterday.  - has improved, tolerating diet, lipase normalized, lft trending down  -off abx.   #  Non-intractable vomiting with nausea associated with  abdomen pain:   resolved.   #  Elevated LFTs: due to Choledocholithiasis: Liver enzymes are trending down. Continue to monitor.   # Hypotension:  BP dropped to 70s on 11/27-28night Bp normalized with ivf /advance diet and limit iv opioid analgesics   Code  Status:Full Family Communication: Patient's mother at bedside. Disposition Plan:  discharge home with GI and general surgery clearance on 11/29.   Consultants:   GI and general surgery  Procedures:   s/p lap cholecystectomy 11/25 Dr. Andrey CampanileWilson ERCP on 11/26 by Dr Madilyn FiremanHayes: Choledocholithiasis was found. Complete removal was accomplished by biliary sphincterotomy and                            balloon extraction  Antimicrobials: Ceftriaxone on 11/25, 26 and 27   Discharge Exam: BP (!) 92/54 (BP Location: Left Arm)   Pulse (!) 55   Temp 98.3 F (36.8 C) (Oral)   Resp 15   Ht 5' (1.524 m)   Wt 76.7 kg (169 lb)   LMP 02/28/2016 Comment: pt does not speak english  SpO2 94%   Breastfeeding? Unknown Comment: neg preg test 03/02/2016  BMI 33.01 kg/m   General: NAD Cardiovascular: RRR Respiratory: CTABL Abdomen: post op changes, site clean , surgical site mild tenderness, no rebound,   Discharge Instructions You were cared for by a hospitalist during your hospital stay. If you have any questions about your discharge medications or the care you received while you were in the hospital after you are discharged, you can call the unit and asked to speak with the hospitalist on call if the hospitalist that took care of you is not available. Once you are discharged, your primary care physician will handle any further medical issues. Please note that NO REFILLS for any discharge medications will be authorized once you are discharged, as it is imperative that you return to your primary care physician (or establish a relationship with a primary care physician if you do not have one) for your aftercare needs so that they can reassess your need for medications and monitor your lab values.  Discharge Instructions    Diet general    Complete by:  As directed    Increase activity slowly    Complete by:  As directed        Medication List    TAKE these medications   acetaminophen 325 MG  tablet Commonly known as:  TYLENOL Take 2 tablets (650 mg total) by mouth every 6 (six) hours as needed for mild pain.   famotidine 20 MG tablet Commonly known as:  PEPCID Take 1 tablet (20 mg total) by mouth 2 (two) times daily.   ibuprofen 200 MG tablet Commonly known as:  ADVIL,MOTRIN Take 2 tablets (400 mg total) by mouth every 6 (six) hours as needed for headache or moderate pain.   oxyCODONE 5 MG immediate release tablet Commonly known as:  Oxy IR/ROXICODONE Take 1-3 tablets (5-15 mg total) by mouth every 4 (four) hours as needed (5mg  for mild pain, 10mg  for moderate pain, 15mg  for severe pain).      No Known Allergies Follow-up Information    [facility] Follow up on 03/28/2016.   Specialty:  General Surgery Why:  Your appointment is at 1:30 PM, be at the office 30 minutes early for check in.  Bring insurance and photo ID for appointment. Contact information: [address] [place] [id number] [phone]  The results of significant diagnostics from this hospitalization (including imaging, microbiology, ancillary and laboratory) are listed below for reference.    Significant Diagnostic Studies: Dg Cholangiogram Operative  Result Date: 03/04/2016 CLINICAL DATA:  25 year old female with a history of cholecystectomy EXAM: INTRAOPERATIVE CHOLANGIOGRAM TECHNIQUE: Cholangiographic images from the C-arm fluoroscopic device were submitted for interpretation post-operatively. Please see the procedural report for the amount of contrast and the fluoroscopy time utilized. COMPARISON:  Ultrasound 03/02/2016, CT 03/02/2016 FINDINGS: Surgical instruments project over the upper abdomen. There is cannulation of the cystic duct/gallbladder neck, with antegrade infusion of contrast. Caliber of the extrahepatic ductal system within normal limits. Vague filling defects within the common bile duct. Contrast does not cross the ampulla into the duodenum.  Extraluminal contrast at the site of cystic duct cannulation and at the inferior liver margin. IMPRESSION: Intraoperative cholangiogram demonstrates no contrast traversing the ampulla with vague filling defects within the common bile duct potentially representing retained debris/ stones. Please refer to the dictated operative report for full details of intraoperative findings and procedure Signed, Yvone NeuJaime S. Loreta AveWagner, DO [facility] Electronically Signed   By: Gilmer MorJaime  Wagner D.O.   On: 03/04/2016 09:20   [place] Abdomen Complete  Result Date: 02/18/2016 CLINICAL DATA:  Abdominal pain beginning 2 weeks ago. EXAM: ABDOMEN ULTRASOUND COMPLETE COMPARISON:  None. FINDINGS: Gallbladder: No gallstones or wall thickening visualized. No sonographic Murphy sign noted by sonographer. Common bile duct: Diameter: 3.4 mm Liver: No focal lesion identified. Within normal limits in parenchymal echogenicity. IVC: No abnormality visualized. Pancreas: Obscured by midline bowel gas. Spleen: Size and appearance within normal limits. Right Kidney: Length: 9.6 cm. Echogenicity within normal limits. No mass or hydronephrosis visualized. Left Kidney: Length: 10.2 cm. Echogenicity within normal limits. No mass or hydronephrosis visualized. Abdominal aorta: No aneurysm visualized. Mid to distal aorta obscured by bowel gas. Other findings: None. IMPRESSION: 1. Normal abdominal ultrasound. 2. Pancreas and mid to distal abdominal aorta not visualized. Electronically Signed   By: Amie Portlandavid  Ormond M.D.   On: 02/18/2016 12:00   Dg Ercp Biliary & Pancreatic Ducts  Result Date: 03/05/2016 CLINICAL DATA:  25 year old female undergoing sphincterotomy for stone removal EXAM: ERCP TECHNIQUE: Multiple spot images obtained with the fluoroscopic device and submitted for interpretation post-procedure. FLUOROSCOPY TIME:  Fluoroscopy Time:  6 minutes 31 seconds Radiation Exposure Index (if provided by the  fluoroscopic device): 102.26 mGy COMPARISON:  Intraoperative cholangiogram 03/04/2016 FINDINGS: A total of 4 intraoperative spot images demonstrate a flexible endoscope in the duodenum with cannulation of the common bile duct and contrast opacification. There is a rounded filling defect in the distal common duct consistent with choledocholithiasis. Subsequent images demonstrate sphincterotomy and balloon sweep of the common duct. IMPRESSION: 1. Choledocholithiasis with a small stone in the distal common bile duct. 2. Sphincterotomy and balloon sweep of the common duct. These images were submitted for radiologic interpretation only. Please see the procedural report for the amount of contrast and the fluoroscopy time utilized. Electronically Signed   By: Malachy MoanHeath  McCullough M.D.   On: 03/05/2016 14:56   Ct Renal Stone Study  Result Date: 03/02/2016 CLINICAL DATA:  Right flank pain. EXAM: CT ABDOMEN AND PELVIS WITHOUT CONTRAST TECHNIQUE: Multidetector CT imaging of the abdomen and pelvis was performed following the standard protocol without IV contrast. COMPARISON:  Abdominal ultrasound 02/18/2016 FINDINGS: Lower chest: Trace bilateral pleural fluid. No visualized pneumonia. Hepatobiliary: Negative liver. Cholelithiasis with calcified stone seen near the neck. Gallbladder is full but there is  no convincing pericholecystic inflammation. Pancreas: Normal Spleen: Normal Adrenals/Urinary Tract: Negative adrenal glands. No hydronephrosis or renal calculus. Negative urinary bladder. Stomach/Bowel: No obstruction or inflammation.  Normal appendix. Vascular/Lymphatic: Negative Reproductive: Negative Other: The lower peritoneum may be mildly thickened, suggesting previous inflammation. No ascites or pneumoperitoneum. Musculoskeletal: Negative IMPRESSION: 1. Cholelithiasis without evidence of cholecystitis. 2. Trace bilateral pleural fluid. Electronically Signed   By: Marnee Spring M.D.   On: 03/02/2016 17:18   US Abdomen  Limited Ruq  Result Date: 03/02/2016 CLINICAL DATA:  Subacute onset of right upper quadrant abdominal pain. Initial encounter. EXAM: US ABDOMEN LIMITED - RIGHT UPPER QUADRANT COMPARISON:  CT of the abdomen and pelvis performed earlier today at 5:00 p.m. FINDINGS: Gallbladder: Multiple stones are noted within the gallbladder, measuring up to 9 mm in size. No gallbladder wall thickening or pericholecystic fluid is seen. Mild sludge is noted within the gallbladder. No ultrasonographic Murphy's sign is elicited. Common bile duct: Diameter: 0.3 cm, within normal limits in caliber. Liver: No focal lesion identified. Within normal limits in parenchymal echogenicity. The left hepatic lobe is not well characterized due to overlying bowel gas. IMPRESSION: No evidence of cholecystitis or obstruction. Cholelithiasis and mild sludge within the gallbladder. Electronically Signed   By: Roanna Raider M.D.   On: 03/02/2016 19:32    Microbiology: Recent Results (from the past 240 hour(s))  Urine culture     Status: Abnormal   Collection Time: 03/02/16  7:22 PM  Result Value Ref Range Status   Specimen Description URINE, RANDOM  Final   Special Requests NONE  Final   Culture MULTIPLE SPECIES PRESENT, SUGGEST RECOLLECTION (A)  Final   Report Status 03/04/2016 FINAL  Final  Surgical PCR screen     Status: None   Collection Time: 03/04/16  4:55 AM  Result Value Ref Range Status   MRSA, PCR NEGATIVE NEGATIVE Final   Staphylococcus aureus NEGATIVE NEGATIVE Final    Comment:        The Xpert SA Assay (FDA approved for NASAL specimens in patients over 50 years of age), is one component of a comprehensive surveillance program.  Test performance has been validated by [facility] for patients greater than or equal to 60 year old. It is not intended to diagnose infection nor to guide or monitor treatment.      Labs: Basic Metabolic Panel:  Recent Labs Lab 03/03/16 0503 03/05/16 0456 03/06/16 0428  03/07/16 0440 03/08/16 1126  NA 138 137 136 141 137  K 4.0 3.9 3.9 3.8 4.1  CL 111 105 103 109 104  CO2 24 25 27 26 27   GLUCOSE 94 99 125* 90 93  BUN 9 8 9 9  5*  CREATININE 0.50 0.58 0.60 0.58 0.79  CALCIUM 8.3* 9.4 9.0 8.8* 9.5  MG  --   --   --   --  2.0   Liver Function Tests:  Recent Labs Lab 03/03/16 0503 03/05/16 0456 03/06/16 0428 03/07/16 0440 03/08/16 1126  AST 410* 170* 99* 54* 55*  ALT 665* 464* 373* 264* 234*  ALKPHOS 120 145* 127* 109 107  BILITOT 1.6* 3.4* 1.6* 0.9 1.0  PROT 5.8* 6.8 6.5 5.8* 6.9  ALBUMIN 3.4* 3.8 3.9 3.5 3.9    Recent Labs Lab 03/02/16 1900 03/06/16 1512 03/07/16 0440 03/08/16 1126  LIPASE 23 101* 19 16   No results for input(s): AMMONIA in the last 168 hours. CBC:  Recent Labs Lab 03/02/16 1504 03/03/16 0503 03/05/16 0456 03/06/16 1512 03/07/16 0440 03/08/16  1126  WBC 5.0 4.0 10.8* 9.6 6.1 7.2  NEUTROABS 3.6  --   --   --  2.7 4.8  HGB 13.6 11.8* 12.1 11.8* 11.1* 13.0  HCT 40.1 35.0* 35.9* 35.2* 33.5* 39.1  MCV 89.3 90.0 89.3 89.8 91.3 91.1  PLT 221 184 191 185 166 189   Cardiac Enzymes: No results for input(s): CKTOTAL, CKMB, CKMBINDEX, TROPONINI in the last 168 hours. BNP: BNP (last 3 results) No results for input(s): BNP in the last 8760 hours.  ProBNP (last 3 results) No results for input(s): PROBNP in the last 8760 hours.  CBG: No results for input(s): GLUCAP in the last 168 hours.     SignedAlbertine Grates MD, PhD  [facility] 03/08/2016, 2:35 PM

## 2020-01-01 ENCOUNTER — Other Ambulatory Visit: Payer: Self-pay

## 2020-01-01 ENCOUNTER — Emergency Department (HOSPITAL_COMMUNITY)
Admission: EM | Admit: 2020-01-01 | Discharge: 2020-01-02 | Disposition: A | Discharge status: Home / Self Care | Payer: Self-pay | Attending: Emergency Medicine | Admitting: Emergency Medicine

## 2020-01-01 ENCOUNTER — Encounter (HOSPITAL_COMMUNITY): Payer: Self-pay | Admitting: Emergency Medicine

## 2020-01-01 DIAGNOSIS — R1011 Right upper quadrant pain: Secondary | ICD-10-CM | POA: Insufficient documentation

## 2020-01-01 DIAGNOSIS — R1110 Vomiting, unspecified: Secondary | ICD-10-CM | POA: Insufficient documentation

## 2020-01-01 DIAGNOSIS — Z5321 Procedure and treatment not carried out due to patient leaving prior to being seen by health care provider: Secondary | ICD-10-CM | POA: Insufficient documentation

## 2020-01-01 LAB — CBC
HCT: 42.2 % (ref 36.0–46.0)
Hemoglobin: 12.8 g/dL (ref 12.0–15.0)
MCH: 28.4 pg (ref 26.0–34.0)
MCHC: 30.3 g/dL (ref 30.0–36.0)
MCV: 93.6 fL (ref 80.0–100.0)
Platelets: 312 10*3/uL (ref 150–400)
RBC: 4.51 MIL/uL (ref 3.87–5.11)
RDW: 14 % (ref 11.5–15.5)
WBC: 12.3 10*3/uL — ABNORMAL HIGH (ref 4.0–10.5)
nRBC: 0 % (ref 0.0–0.2)

## 2020-01-01 LAB — COMPREHENSIVE METABOLIC PANEL
ALT: 41 U/L (ref 0–44)
AST: 35 U/L (ref 15–41)
Albumin: 4.2 g/dL (ref 3.5–5.0)
Alkaline Phosphatase: 84 U/L (ref 38–126)
Anion gap: 10 (ref 5–15)
BUN: 5 mg/dL — ABNORMAL LOW (ref 6–20)
CO2: 23 mmol/L (ref 22–32)
Calcium: 9.9 mg/dL (ref 8.9–10.3)
Chloride: 104 mmol/L (ref 98–111)
Creatinine, Ser: 0.73 mg/dL (ref 0.44–1.00)
GFR calc Af Amer: >60 mL/min (ref >60)
GFR calc non Af Amer: >60 mL/min (ref >60)
Glucose, Bld: 109 mg/dL — ABNORMAL HIGH (ref 70–99)
Potassium: 4.5 mmol/L (ref 3.5–5.1)
Sodium: 137 mmol/L (ref 135–145)
Total Bilirubin: 0.8 mg/dL (ref 0.3–1.2)
Total Protein: 8.4 g/dL — ABNORMAL HIGH (ref 6.5–8.1)

## 2020-01-01 LAB — I-STAT BETA HCG BLOOD, ED (MC, WL, AP ONLY): I-stat hCG, quantitative: <5 m[IU]/mL (ref <5)

## 2020-01-01 LAB — LIPASE, BLOOD: Lipase: 20 U/L (ref 11–51)

## 2020-01-01 NOTE — ED Triage Notes (Signed)
C/o RUQ pain with vomiting and diarrhea since this morning.  Denies diarrhea.

## 2020-01-02 NOTE — ED Notes (Signed)
Called for pt x3, no response. 

## 2020-07-14 ENCOUNTER — Other Ambulatory Visit: Payer: Self-pay

## 2020-07-14 ENCOUNTER — Ambulatory Visit (INDEPENDENT_AMBULATORY_CARE_PROVIDER_SITE_OTHER): Payer: Self-pay | Admitting: Obstetrics & Gynecology

## 2020-07-14 ENCOUNTER — Encounter: Payer: Self-pay | Admitting: Obstetrics & Gynecology

## 2020-07-14 VITALS — BP 122/78 | Ht 62.0 in | Wt 168.0 lb

## 2020-07-14 DIAGNOSIS — N921 Excessive and frequent menstruation with irregular cycle: Secondary | ICD-10-CM

## 2020-07-14 NOTE — Progress Notes (Signed)
    Chelsey Kemp January 11, 1991 [id number]        30 y.o.  G2P0011 Married x 1 year.  RP: Menometrorrhagia on BCPs  HPI: Took 3 BCPs daily x 2 days on 4/3rd and 4/4th which successfully stopped the bleeding.  Still cramping some.  Attempting conception.  UPT Neg 07/09/2020.  Hb normal at 13.2 and TSH normal on 07/09/2020.   OB History  Gravida Para Term Preterm AB Living  2 1     1 1   SAB IAB Ectopic Multiple Live Births  1            # Outcome Date GA Lbr Len/2nd Weight Sex Delivery Anes PTL Lv  2 SAB           1 Para             Past medical history,surgical history, problem list, medications, allergies, family history and social history were all reviewed and documented in the EPIC chart.   Directed ROS with pertinent positives and negatives documented in the history of present illness/assessment and plan.  Exam:  Vitals:   07/14/20 1142  BP: 122/78  Weight: 168 lb (76.2 kg)  Height: 5\' 2"  (1.575 m)   General appearance:  Normal  Abdomen: Normal  Gynecologic exam: Vulva normal.  Speculum:  Cervix/Vagina normal.  No current bleeding.  Normal secretions.  Bimanual exam:  Uterus AV, normal volume, NT, mobile.  No adnexal mass/NT bilaterally.   Assessment/Plan:  30 y.o. G2P0011   1. Menometrorrhagia Menometrorrhagia, improved with BCPs, taking 3 tab/day.  Normal gynecologic exam.  No anemia, Hb 13.2.  Will investigate further with a Pelvic at f/u.  R/O pregnancy with sHCG.  R/O Hyperprolactinemia.  Recent TSH normal. - hCG, serum, qualitative - Prolactin - 26 Transvaginal Non-OB; Future  Other orders - norgestimate-ethinyl estradiol (ORTHO-CYCLEN) 0.25-35 MG-MCG tablet; Take 1 tablet by mouth daily.  [place] MD, 11:50 AM 07/14/2020

## 2020-07-15 LAB — PROLACTIN: Prolactin: 8.9 ng/mL

## 2020-07-15 LAB — HCG, SERUM, QUALITATIVE: Preg, Serum: NEGATIVE

## 2020-07-22 ENCOUNTER — Encounter: Payer: Self-pay | Admitting: Obstetrics & Gynecology

## 2020-07-22 ENCOUNTER — Telehealth: Payer: Self-pay | Admitting: Anesthesiology

## 2020-07-22 NOTE — Telephone Encounter (Signed)
Called patient retuning her call regarding her heavy bleeding to inform her per Dr Seymour Bars  that she needs to continue taking her birth control pill twice a day until she stops bleeding and then continue to take 1 pill a day until she is able to come for her ultrasound on May/5/22. Patient agreed with this. Advised patient to call back if her bleeding gets worse before her appt time.

## 2020-07-23 ENCOUNTER — Other Ambulatory Visit: Payer: Self-pay

## 2020-07-23 ENCOUNTER — Ambulatory Visit
Admission: RE | Admit: 2020-07-23 | Discharge: 2020-07-23 | Disposition: A | Discharge status: Home / Self Care | Payer: Self-pay | Source: Ambulatory Visit | Attending: Obstetrics and Gynecology | Admitting: Obstetrics and Gynecology

## 2020-07-23 ENCOUNTER — Other Ambulatory Visit: Payer: Self-pay | Admitting: Obstetrics and Gynecology

## 2020-07-23 DIAGNOSIS — Z111 Encounter for screening for respiratory tuberculosis: Secondary | ICD-10-CM

## 2020-08-12 ENCOUNTER — Ambulatory Visit (INDEPENDENT_AMBULATORY_CARE_PROVIDER_SITE_OTHER): Payer: Self-pay | Admitting: Obstetrics & Gynecology

## 2020-08-12 ENCOUNTER — Other Ambulatory Visit: Payer: Self-pay

## 2020-08-12 ENCOUNTER — Encounter: Payer: Self-pay | Admitting: Obstetrics & Gynecology

## 2020-08-12 ENCOUNTER — Ambulatory Visit: Payer: Self-pay

## 2020-08-12 VITALS — BP 122/72

## 2020-08-12 DIAGNOSIS — N921 Excessive and frequent menstruation with irregular cycle: Secondary | ICD-10-CM

## 2020-08-12 DIAGNOSIS — Z538 Procedure and treatment not carried out for other reasons: Secondary | ICD-10-CM

## 2020-08-12 DIAGNOSIS — E282 Polycystic ovarian syndrome: Secondary | ICD-10-CM

## 2020-08-12 NOTE — Progress Notes (Signed)
    Chelsey Kemp Dec 23, 1990 [id number]        30 y.o.  G2P0011   RP: Counseling on Menometrorrhagia post Pelvic US  HPI:  Had menometrorrhagia with vaginal bleeding most days for about 40 days.  Took BCPs to stop the bleeding.  Not bleeding anymore.  Just starting to attempt conception.   OB History  Gravida Para Term Preterm AB Living  2 1     1 1   SAB IAB Ectopic Multiple Live Births  1            # Outcome Date GA Lbr Len/2nd Weight Sex Delivery Anes PTL Lv  2 SAB           1 Para             Past medical history,surgical history, problem list, medications, allergies, family history and social history were all reviewed and documented in the EPIC chart.   Directed ROS with pertinent positives and negatives documented in the history of present illness/assessment and plan.  Exam:  Vitals:   08/12/20 1054  BP: 122/72   General appearance:  Normal  Pelvic 10/12/20 08/04/2020:  . Uterus: The uterus measures 8.6 x 4.7 x 5.9 cm. Normal size, contour, and echotexture. No focal lesion is identified. The endometrial echo complex measures 5 mm in width.  . Right ovary/adnexa: Normal contour and echotexture. No mass. The ovary measures 5.0 x 3.1 x 3.9 cm. Volume = 31 mL. Abnormally increased follicle number with background ovarian enlargement.  . Left ovary/adnexa: Normal size, contour, and echotexture. No mass. The ovary measures 4.7 x 2.3 x 1.9 cm. Volume = 10 mL.  08/06/2020 Peritoneum: No fluid in the cul-de-sac.     Assessment/Plan:  30 y.o. G2P0011   1. Menometrorrhagia Pelvic 26 findings thoroughly reviewed with patient.  Patient reassured about her normal uterus and thin endometrial line. No ovarian Cyst/Mass, but probable PCOS, Rt Ovary.  Currently not bleeding and desires conception.  Will therefore observe without hormonal treatment at this time.  2. PCOS (polycystic ovarian syndrome) Counseling done on Oligo-Ovulation. Rt Ovary c/w PCOS.  Decision to observe at this time and  attempt conception.  Will f/u in 3 months to reassess her menstrual cycle/Ovulation.  May have to stimulate ovulation with Clomid/Femara.  PNVs.  [place] MD, 11:22 AM 08/12/2020

## 2020-08-15 ENCOUNTER — Encounter: Payer: Self-pay | Admitting: Obstetrics & Gynecology

## 2020-11-12 ENCOUNTER — Ambulatory Visit: Payer: Self-pay | Admitting: Obstetrics & Gynecology

## 2021-02-09 ENCOUNTER — Ambulatory Visit (INDEPENDENT_AMBULATORY_CARE_PROVIDER_SITE_OTHER): Payer: Self-pay | Admitting: Obstetrics & Gynecology

## 2021-02-09 ENCOUNTER — Encounter: Payer: Self-pay | Admitting: Obstetrics & Gynecology

## 2021-02-09 ENCOUNTER — Other Ambulatory Visit: Payer: Self-pay

## 2021-02-09 VITALS — BP 110/70 | HR 79

## 2021-02-09 DIAGNOSIS — Z3169 Encounter for other general counseling and advice on procreation: Secondary | ICD-10-CM

## 2021-02-09 DIAGNOSIS — N921 Excessive and frequent menstruation with irregular cycle: Secondary | ICD-10-CM

## 2021-02-09 DIAGNOSIS — E282 Polycystic ovarian syndrome: Secondary | ICD-10-CM

## 2021-02-09 LAB — PREGNANCY, URINE: Preg Test, Ur: NEGATIVE

## 2021-02-09 NOTE — Progress Notes (Signed)
    Chelsey Kemp December 04, 1990 [id number]        30 y.o.  G2P0011 Married x 1 1/2 year.  Son is 43 yo.  RP: Menorrhagia with pelvic cramps and ovulatory pain  HPI: Menses heavy every 4-6 weeks.  Heavy flow in 12/2020.  LMP 02/07/2021 with moderate flow currently.  C/O severe pelvic cramps during her period and at mid cycle.  No h/o STD.  Attempting conception x 08/2020, husband had no previous paternity, but wants to focus on her health at this time, not on Fertility.  Had Pre-Eclampsia with son who was born at 8 months (Different FOB).     OB History  Gravida Para Term Preterm AB Living  2 1     1 1   SAB IAB Ectopic Multiple Live Births  1            # Outcome Date GA Lbr Len/2nd Weight Sex Delivery Anes PTL Lv  2 SAB           1 Para             Past medical history,surgical history, problem list, medications, allergies, family history and social history were all reviewed and documented in the EPIC chart.   Directed ROS with pertinent positives and negatives documented in the history of present illness/assessment and plan.  Exam:  Vitals:   02/09/21 1505  BP: 110/70  Pulse: 79   General appearance:  Normal  Abdomen: Normal  Gynecologic exam: Vulva normal.  Bimanual exam: Uterus AV normal volume, mobile, mildly tender.  No adnexal mass felt.  Mild tenderness on the Left.  UPT Neg  Pelvic 13/02/22 08/04/2020:  .  Uterus: The uterus measures 8.6 x 4.7 x 5.9 cm. Normal size, contour, and echotexture. No focal lesion is identified. The endometrial echo complex measures 5 mm in width.  .  Right ovary/adnexa: Normal contour and echotexture. No mass. The ovary measures 5.0 x 3.1 x 3.9 cm. Volume = 31 mL. Abnormally increased follicle number with background ovarian enlargement.  .  Left ovary/adnexa: Normal size, contour, and echotexture. No mass. The ovary measures 4.7 x 2.3 x 1.9 cm. Volume = 10 mL.  08/06/2020  Peritoneum: No fluid in the cul-de-sac.      Assessment/Plan:  30 y.o. G2P0011    1. Menorrhagia with irregular cycle Heavy manses since September 2022.  Cycles every 4 to 6 weeks.  Also complains of dysmenorrhea and midcycle pain.  Pelvic ultrasound with normal-sized uterus and no adnexal mass, but mildly tender uterus and left adnexa.  Decision to further investigate with a pelvic ultrasound at follow-up.  Declines STI screening. - October 2022 Transvaginal Non-OB; Future  2. PCOS (polycystic ovarian syndrome) Appearance of mild PCOS on the right ovary on pelvic ultrasound April 2022.  We will repeat an ultrasound at follow-up.  Patient has been trying to conceive for the last 6 months.  May consider Clomid stimulation in the future. - May 2022 Transvaginal Non-OB; Future  3. Encounter for preconception consultation Urine pregnancy test negative today.  Continue prenatal vitamins. - Pregnancy, urine   [place] MD, 3:50 PM 02/09/2021

## 2021-02-10 ENCOUNTER — Encounter: Payer: Self-pay | Admitting: Obstetrics & Gynecology

## 2021-02-24 ENCOUNTER — Encounter: Payer: Self-pay | Admitting: Anesthesiology

## 2021-02-24 ENCOUNTER — Encounter: Payer: Self-pay | Admitting: Obstetrics & Gynecology

## 2021-02-24 ENCOUNTER — Ambulatory Visit (INDEPENDENT_AMBULATORY_CARE_PROVIDER_SITE_OTHER): Payer: Self-pay

## 2021-02-24 ENCOUNTER — Ambulatory Visit (INDEPENDENT_AMBULATORY_CARE_PROVIDER_SITE_OTHER): Payer: Self-pay | Admitting: Obstetrics & Gynecology

## 2021-02-24 ENCOUNTER — Other Ambulatory Visit: Payer: Self-pay

## 2021-02-24 VITALS — BP 112/70

## 2021-02-24 DIAGNOSIS — N921 Excessive and frequent menstruation with irregular cycle: Secondary | ICD-10-CM

## 2021-02-24 DIAGNOSIS — N979 Female infertility, unspecified: Secondary | ICD-10-CM

## 2021-02-24 DIAGNOSIS — E282 Polycystic ovarian syndrome: Secondary | ICD-10-CM

## 2021-02-24 MED ORDER — CLOMIPHENE CITRATE 50 MG PO TABS: 50.0000 mg | ORAL_TABLET | Freq: Every day | ORAL | 0 refills | Status: AC

## 2021-02-24 NOTE — Progress Notes (Signed)
    Chelsey Kemp Oct 24, 1990 [id number]        30 y.o.  G2P0011   RP: PCOS/Menorrhagia for Pelvic US  HPI: Menses heavy every 4-6 weeks.  Heavy flow in 12/2020.  LMP 02/07/2021 with moderate flow currently.  C/O severe pelvic cramps during her period and at mid cycle.  No h/o STD.  Attempting conception x 08/2020, husband had no previous paternity, but wants to focus on her health at this time, not on Fertility.  Had Pre-Eclampsia with son who was born at 8 months (Different FOB).     OB History  Gravida Para Term Preterm AB Living  2 1     1 1   SAB IAB Ectopic Multiple Live Births  1            # Outcome Date GA Lbr Len/2nd Weight Sex Delivery Anes PTL Lv  2 SAB           1 Para             Past medical history,surgical history, problem list, medications, allergies, family history and social history were all reviewed and documented in the EPIC chart.   Directed ROS with pertinent positives and negatives documented in the history of present illness/assessment and plan.  Exam:  Vitals:   02/24/21 1453  BP: 112/70   General appearance:  Normal  Pelvic 02/26/21 today: T/V images.  Anteverted anteflexed uterus normal in size and shape with no myometrial mass.  The uterus is measured at 8.79 x 5.34 x 4.05 cm.  The endometrial lining is symmetrical and normal measured at 9 mm on 5 full day #17.  No mass or thickening seen.  Both ovaries are mildly enlarged with mild multi-follicular pattern and bilaterally good perfusion.  Appearance of PCOS.  No adnexal mass seen.  No free fluid in the pelvis.   Assessment/Plan:  30 y.o. G2P0011   1. Menorrhagia with irregular cycle Pelvic ultrasound findings thoroughly reviewed with patient.  Patient is reassured that her uterus and endometrial lining are normal.  Ovaries compatible with PCOS.  Now that patient is reassured with the pelvic ultrasound findings, she wants to focus on fertility.  2. PCOS (polycystic ovarian syndrome) Probable oligo  ovulation with irregular cycles every 4 to 6 weeks.  PCOS appearance on pelvic ultrasound.  Decision to stimulate with Clomiphene.  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  No history of STI.  New partner with no paternity.  Will investigate with a semen analysis.  Decision to start on Clomiphene 50 mg 1 tab PO day 3-7th of the cycle.  No contraindication.  Usage reviewed and prescription sent to pharmacy.  Will confirm ovulation with a day 21 progesterone on Clomiphene cycle. - Semen Analysis, Basic - Progesterone  Other orders - clomiPHENE (CLOMID) 50 MG tablet; Take 1 tablet (50 mg total) PO daily from day 3 through 7 of the cycle.    37 MD, 3:15 PM 02/24/2021

## 2021-02-26 ENCOUNTER — Encounter: Payer: Self-pay | Admitting: Obstetrics & Gynecology

## 2021-03-02 ENCOUNTER — Telehealth: Payer: Self-pay

## 2021-03-02 NOTE — Telephone Encounter (Signed)
Patient's partner signed [facility] release form for semen analysis. Order faxed to [facility] for semen analysis.

## 2021-03-11 ENCOUNTER — Telehealth: Payer: Self-pay | Admitting: *Deleted

## 2021-03-11 NOTE — Telephone Encounter (Signed)
Patient called and spoke with Debarah Crape stating her clomid 50 mg tablet was not at pharmacy. Rx was sent on OV 02/24/21. I called [facility] and was told Rx was received but "closed" and they can "open" Rx but they do not have Rx in stock now and will be delivered on Monday 03/14/21.  I will route to [place] for her to relay on Monday.

## 2021-03-16 ENCOUNTER — Other Ambulatory Visit: Payer: Self-pay

## 2021-03-16 ENCOUNTER — Emergency Department (HOSPITAL_COMMUNITY): Payer: Medicaid - Out of State

## 2021-03-16 ENCOUNTER — Encounter (HOSPITAL_COMMUNITY): Payer: Self-pay | Admitting: Oncology

## 2021-03-16 ENCOUNTER — Emergency Department (HOSPITAL_COMMUNITY)
Admission: EM | Admit: 2021-03-16 | Discharge: 2021-03-17 | Disposition: A | Discharge status: Home / Self Care | Payer: Medicaid - Out of State | Attending: Emergency Medicine | Admitting: Emergency Medicine

## 2021-03-16 DIAGNOSIS — S3991XA Unspecified injury of abdomen, initial encounter: Secondary | ICD-10-CM | POA: Diagnosis present

## 2021-03-16 DIAGNOSIS — S301XXA Contusion of abdominal wall, initial encounter: Secondary | ICD-10-CM | POA: Diagnosis not present

## 2021-03-16 DIAGNOSIS — R109 Unspecified abdominal pain: Secondary | ICD-10-CM

## 2021-03-16 DIAGNOSIS — Y990 Civilian activity done for income or pay: Secondary | ICD-10-CM | POA: Diagnosis not present

## 2021-03-16 DIAGNOSIS — R110 Nausea: Secondary | ICD-10-CM | POA: Diagnosis not present

## 2021-03-16 DIAGNOSIS — W228XXA Striking against or struck by other objects, initial encounter: Secondary | ICD-10-CM | POA: Diagnosis not present

## 2021-03-16 LAB — URINALYSIS, ROUTINE W REFLEX MICROSCOPIC
Bacteria, UA: NONE SEEN
Bilirubin Urine: NEGATIVE
Glucose, UA: NEGATIVE mg/dL
Ketones, ur: NEGATIVE mg/dL
Leukocytes,Ua: NEGATIVE
Nitrite: NEGATIVE
RBC / HPF: >50 RBC/hpf — ABNORMAL HIGH (ref 0–5)
Specific Gravity, Urine: 1.01 (ref 1.005–1.030)
pH: 6.5 (ref 5.0–8.0)

## 2021-03-16 LAB — CBC WITH DIFFERENTIAL/PLATELET
Abs Immature Granulocytes: 0.02 10*3/uL (ref 0.00–0.07)
Basophils Absolute: 0 10*3/uL (ref 0.0–0.1)
Basophils Relative: 0 %
Eosinophils Absolute: 0.1 10*3/uL (ref 0.0–0.5)
Eosinophils Relative: 1 %
HCT: 41.3 % (ref 36.0–46.0)
Hemoglobin: 13.6 g/dL (ref 12.0–15.0)
Immature Granulocytes: 0 %
Lymphocytes Relative: 31 %
Lymphs Abs: 2.1 10*3/uL (ref 0.7–4.0)
MCH: 29.9 pg (ref 26.0–34.0)
MCHC: 32.9 g/dL (ref 30.0–36.0)
MCV: 90.8 fL (ref 80.0–100.0)
Monocytes Absolute: 0.3 10*3/uL (ref 0.1–1.0)
Monocytes Relative: 4 %
Neutro Abs: 4.2 10*3/uL (ref 1.7–7.7)
Neutrophils Relative %: 64 %
Platelets: 261 10*3/uL (ref 150–400)
RBC: 4.55 MIL/uL (ref 3.87–5.11)
RDW: 12.8 % (ref 11.5–15.5)
WBC: 6.6 10*3/uL (ref 4.0–10.5)
nRBC: 0 % (ref 0.0–0.2)

## 2021-03-16 LAB — COMPREHENSIVE METABOLIC PANEL
ALT: 32 U/L (ref 0–44)
AST: 24 U/L (ref 15–41)
Albumin: 4.4 g/dL (ref 3.5–5.0)
Alkaline Phosphatase: 87 U/L (ref 38–126)
Anion gap: 7 (ref 5–15)
BUN: 13 mg/dL (ref 6–20)
CO2: 25 mmol/L (ref 22–32)
Calcium: 9.3 mg/dL (ref 8.9–10.3)
Chloride: 105 mmol/L (ref 98–111)
Creatinine, Ser: 0.48 mg/dL (ref 0.44–1.00)
GFR, Estimated: >60 mL/min (ref >60)
Glucose, Bld: 92 mg/dL (ref 70–99)
Potassium: 4.1 mmol/L (ref 3.5–5.1)
Sodium: 137 mmol/L (ref 135–145)
Total Bilirubin: 0.4 mg/dL (ref 0.3–1.2)
Total Protein: 7.8 g/dL (ref 6.5–8.1)

## 2021-03-16 LAB — LIPASE, BLOOD: Lipase: 22 U/L (ref 11–51)

## 2021-03-16 LAB — I-STAT BETA HCG BLOOD, ED (MC, WL, AP ONLY): I-stat hCG, quantitative: <5 m[IU]/mL (ref <5)

## 2021-03-16 MED ORDER — FENTANYL CITRATE PF 50 MCG/ML IJ SOSY
50.0000 ug | PREFILLED_SYRINGE | Freq: Once | INTRAMUSCULAR | Status: AC
  Administered 2021-03-16: 50 ug via INTRAVENOUS
  Filled 2021-03-16: qty 1

## 2021-03-16 MED ORDER — ONDANSETRON HCL 4 MG/2ML IJ SOLN
4.0000 mg | Freq: Once | INTRAMUSCULAR | Status: AC
  Administered 2021-03-16: 4 mg via INTRAVENOUS
  Filled 2021-03-16: qty 2

## 2021-03-16 MED ORDER — IOHEXOL 350 MG/ML SOLN
80.0000 mL | Freq: Once | INTRAVENOUS | Status: AC | PRN
  Administered 2021-03-16: 80 mL via INTRAVENOUS

## 2021-03-16 MED ORDER — IBUPROFEN 800 MG PO TABS: 800.0000 mg | ORAL_TABLET | Freq: Three times a day (TID) | ORAL | 0 refills | Status: AC

## 2021-03-16 MED ORDER — METHOCARBAMOL 500 MG PO TABS: 500.0000 mg | ORAL_TABLET | Freq: Two times a day (BID) | ORAL | 0 refills | Status: AC

## 2021-03-16 NOTE — ED Provider Notes (Signed)
Emergency Medicine Provider Triage Evaluation Note  Chelsey Kemp , a 30 y.o. female  was evaluated in triage.  Pt complains of abd pain, flank pain, trauma.  Review of Systems  Positive: Abd pain, flank pain, bruising Negative: vomiting  Physical Exam  There were no vitals taken for this visit. Gen:   Awake, no distress   Resp:  Normal effort  MSK:   Moves extremities without difficulty  Other:  Left cva ttp, ecchymosis to the left iliac crest   Medical Decision Making  Medically screening exam initiated at 2:32 PM.  Appropriate orders placed.  Chelsey Kemp was informed that the remainder of the evaluation will be completed by another provider, this initial triage assessment does not replace that evaluation, and the importance of remaining in the ED until their evaluation is complete.     Chelsey Meres, PA-C 03/16/21 1435    Chelsey Loveless, MD 03/17/21 [id number]

## 2021-03-16 NOTE — ED Provider Notes (Signed)
[facility]-EMERGENCY DEPT Provider Note   CSN: [id number] Arrival date & time: 03/16/21  1417     History Chief Complaint  Patient presents with   Abdominal Pain    Chelsey Kemp is a 30 y.o. female.  The history is provided by the patient and medical records. The history is limited by a language barrier. A language interpreter was used.  Abdominal Pain  30 year old female presenting to the ED with left flank pain.  States she was at work and involved in an accident where a pallet hit her on left flank.  No head injury or LOC. States this occurred on Friday (5 days ago) and since has had worsening pain.  She reports some nausea but no vomiting.  No blood in the urine or stool.  No fever/chills.  Past Medical History:  Diagnosis Date   Gastritis    Pre-eclampsia     Patient Active Problem List   Diagnosis Date Noted   Gall stones, common bile duct    Choledocholithiasis with obstruction 03/04/2016   Gastritis    Non-intractable vomiting with nausea    Elevated LFTs    Pain of upper abdomen    Acute cholecystitis s/p lap cholecystectomy 03/04/2016 03/02/2016    Past Surgical History:  Procedure Laterality Date   CESAREAN SECTION     CHOLECYSTECTOMY N/A 03/04/2016   Procedure: LAPAROSCOPIC CHOLECYSTECTOMY WITH INTRAOPERATIVE CHOLANGIOGRAM;  Surgeon: Gaynelle Adu, MD;  Location: [facility];  Service: General;  Laterality: N/A;   ERCP N/A 03/05/2016   Procedure: ENDOSCOPIC RETROGRADE CHOLANGIOPANCREATOGRAPHY (ERCP);  Surgeon: Dorena Cookey, MD;  Location: [facility];  Service: Endoscopy;  Laterality: N/A;   LAPAROSCOPIC CHOLECYSTECTOMY W/ CHOLANGIOGRAPHY  03/04/2016   Dr Gaynelle Adu     OB History     Gravida  2   Para  1   Term      Preterm      AB  1   Living  1      SAB  1   IAB      Ectopic      Multiple      Live Births              Family History  Problem Relation Age of Onset   Diabetes Maternal Grandmother     Hypertension Maternal Grandfather     Social History   Tobacco Use   Smoking status: Never   Smokeless tobacco: Never  Vaping Use   Vaping Use: Never used  Substance Use Topics   Alcohol use: No   Drug use: Not Currently    Home Medications Prior to Admission medications   Medication Sig Start Date End Date Taking? Authorizing Provider  ibuprofen (ADVIL) 800 MG tablet Take 1 tablet (800 mg total) by mouth 3 (three) times daily. 03/16/21  Yes Garlon Hatchet, PA-C  methocarbamol (ROBAXIN) 500 MG tablet Take 1 tablet (500 mg total) by mouth 2 (two) times daily. 03/16/21  Yes Garlon Hatchet, PA-C  acetaminophen (TYLENOL) 325 MG tablet Take 2 tablets (650 mg total) by mouth every 6 (six) hours as needed for mild pain. 03/07/16   Sherrie George, PA-C    Allergies    Patient has no known allergies.  Review of Systems   Review of Systems  Gastrointestinal:  Positive for abdominal pain.  All other systems reviewed and are negative.  Physical Exam Updated Vital Signs BP 101/67 (BP Location: Left Arm)   Pulse 80   Temp 98.5 F (36.9  C) (Oral)   Resp 16   SpO2 99%   Physical Exam Vitals and nursing note reviewed.  Constitutional:      Appearance: She is well-developed.  HENT:     Head: Normocephalic and atraumatic.  Eyes:     Conjunctiva/sclera: Conjunctivae normal.     Pupils: Pupils are equal, round, and reactive to light.  Cardiovascular:     Rate and Rhythm: Normal rate and regular rhythm.     Heart sounds: Normal heart sounds.  Pulmonary:     Effort: Pulmonary effort is normal.     Breath sounds: Normal breath sounds.  Abdominal:     General: Bowel sounds are normal.     Palpations: Abdomen is soft.     Comments: Minor bruising noted to left flank, appears to be in various stages of healing, locally tender, no gray-turner sign  Musculoskeletal:        General: Normal range of motion.     Cervical back: Normal range of motion.  Skin:    General: Skin is warm  and dry.  Neurological:     Mental Status: She is alert and oriented to person, place, and time.    ED Results / Procedures / Treatments   Labs (all labs ordered are listed, but only abnormal results are displayed) Labs Reviewed  URINALYSIS, ROUTINE W REFLEX MICROSCOPIC - Abnormal; Notable for the following components:      Result Value   Color, Urine RED (*)    APPearance CLOUDY (*)    Hgb urine dipstick LARGE (*)    Protein, ur TRACE (*)    RBC / HPF >50 (*)    All other components within normal limits  CBC WITH DIFFERENTIAL/PLATELET  COMPREHENSIVE METABOLIC PANEL  LIPASE, BLOOD  I-STAT BETA HCG BLOOD, ED ([facility], [facility], [facility] ONLY)    EKG None  Radiology DG Ribs Unilateral W/Chest Left  Result Date: 03/16/2021 CLINICAL DATA:  Pain EXAM: LEFT RIBS AND CHEST - 3+ VIEW COMPARISON:  Chest radiograph April 2022 FINDINGS: No fracture or other bone lesions are seen involving the ribs. There is no evidence of pneumothorax or pleural effusion. Both lungs are clear. Heart size and mediastinal contours are within normal limits. IMPRESSION: Negative. Electronically Signed   By: Guadlupe Spanish M.D.   On: 03/16/2021 15:09   CT ABDOMEN PELVIS W CONTRAST  Result Date: 03/16/2021 CLINICAL DATA:  Trauma left sided abdomen and flank pain EXAM: CT ABDOMEN AND PELVIS WITH CONTRAST TECHNIQUE: Multidetector CT imaging of the abdomen and pelvis was performed using the standard protocol following bolus administration of intravenous contrast. CONTRAST:  28mL OMNIPAQUE IOHEXOL 350 MG/ML SOLN COMPARISON:  CT 03/02/2016 FINDINGS: Lower chest: No acute abnormality. Hepatobiliary: No focal liver abnormality is seen. Status post cholecystectomy. No biliary dilatation. Pancreas: Unremarkable. No pancreatic ductal dilatation or surrounding inflammatory changes. Spleen: Normal in size without focal abnormality. Adrenals/Urinary Tract: Adrenal glands are unremarkable. Kidneys are normal, without renal calculi, focal lesion,  or hydronephrosis. Bladder is unremarkable. Stomach/Bowel: Stomach is within normal limits. Appendix appears normal. No evidence of bowel wall thickening, distention, or inflammatory changes. Vascular/Lymphatic: No significant vascular findings are present. No enlarged abdominal or pelvic lymph nodes. Reproductive: Uterus and bilateral adnexa are unremarkable. Other: No abdominal wall hernia or abnormality. No abdominopelvic ascites. Musculoskeletal: No acute or significant osseous findings. IMPRESSION: No CT evidence for acute intra-abdominal or intrapelvic abnormality. Electronically Signed   By: Jasmine Pang M.D.   On: 03/16/2021 18:38    Procedures Procedures  Medications Ordered in ED Medications  iohexol (OMNIPAQUE) 350 MG/ML injection 80 mL (80 mLs Intravenous Contrast Given 03/16/21 1802)  fentaNYL (SUBLIMAZE) injection 50 mcg (50 mcg Intravenous Given 03/16/21 2312)  ondansetron (ZOFRAN) injection 4 mg (4 mg Intravenous Given 03/16/21 2312)    ED Course  I have reviewed the triage vital signs and the nursing notes.  Pertinent labs & imaging results that were available during my care of the patient were reviewed by me and considered in my medical decision making (see chart for details).    MDM Rules/Calculators/A&P                           30 year old female presenting to the ED with left flank pain after being struck with a pallet at work 5 days ago.  She has some faint bruising to the left flank but this appears to be in various stages of healing.  She has no Gray-Turner sign.  Labs and CT obtained from triage which are reassuring without findings of internal injuries.  Patient reports uncontrolled pain and nausea.  She is given dose of fentanyl and Zofran.  Will plan to d/c home with symptomatic care and close PCP follow-up.  Return here for new concerns.  Final Clinical Impression(s) / ED Diagnoses Final diagnoses:  Left flank pain    Rx / DC Orders ED Discharge Orders           Ordered    methocarbamol (ROBAXIN) 500 MG tablet  2 times daily        03/16/21 2325    ibuprofen (ADVIL) 800 MG tablet  3 times daily        03/16/21 2325             Garlon Hatchet, PA-C 03/16/21 2326    Horton, Clabe Seal, DO 03/16/21 2332

## 2021-03-16 NOTE — ED Notes (Signed)
Pt ambulatory to bathroom w/o assist. Stable gait

## 2021-03-16 NOTE — Discharge Instructions (Addendum)
Your labs and CT today were reassuring-- no findings of internal injuries. Left flank will likely be sore until bruising fully resolves.  Take the prescribed medication as directed. Follow-up with your primary care doctor. Return to the ED for new or worsening symptoms.

## 2021-03-16 NOTE — ED Triage Notes (Signed)
Pt was hit in the back w/ a pallet on the left side.  Bruising noted. Rates pain 9/10.

## 2021-03-17 ENCOUNTER — Ambulatory Visit: Payer: Self-pay | Admitting: Obstetrics & Gynecology

## 2021-03-17 DIAGNOSIS — Z0289 Encounter for other administrative examinations: Secondary | ICD-10-CM

## 2021-03-17 NOTE — ED Notes (Signed)
Pt A&OX4 ambulatory at d/c with independent steady gait. Pt verbalized understanding of d/c instructions, prescriptions and follow up care. D/c instructions explained using electronic ipad spanish interpreter. Pt reports her husband will be driving her home.

## 2021-03-21 ENCOUNTER — Emergency Department (HOSPITAL_COMMUNITY)
Admission: EM | Admit: 2021-03-21 | Discharge: 2021-03-22 | Disposition: A | Discharge status: Home / Self Care | Payer: Medicaid - Out of State | Attending: Emergency Medicine | Admitting: Emergency Medicine

## 2021-03-21 ENCOUNTER — Other Ambulatory Visit: Payer: Self-pay

## 2021-03-21 ENCOUNTER — Encounter (HOSPITAL_COMMUNITY): Payer: Self-pay

## 2021-03-21 DIAGNOSIS — S299XXD Unspecified injury of thorax, subsequent encounter: Secondary | ICD-10-CM | POA: Diagnosis present

## 2021-03-21 DIAGNOSIS — Y990 Civilian activity done for income or pay: Secondary | ICD-10-CM | POA: Diagnosis not present

## 2021-03-21 DIAGNOSIS — W240XXD Contact with lifting devices, not elsewhere classified, subsequent encounter: Secondary | ICD-10-CM | POA: Insufficient documentation

## 2021-03-21 DIAGNOSIS — R109 Unspecified abdominal pain: Secondary | ICD-10-CM | POA: Diagnosis not present

## 2021-03-21 DIAGNOSIS — S301XXD Contusion of abdominal wall, subsequent encounter: Secondary | ICD-10-CM

## 2021-03-21 DIAGNOSIS — S20229D Contusion of unspecified back wall of thorax, subsequent encounter: Secondary | ICD-10-CM | POA: Insufficient documentation

## 2021-03-21 LAB — CBC
HCT: 43.5 % (ref 36.0–46.0)
Hemoglobin: 14.8 g/dL (ref 12.0–15.0)
MCH: 30.1 pg (ref 26.0–34.0)
MCHC: 34 g/dL (ref 30.0–36.0)
MCV: 88.4 fL (ref 80.0–100.0)
Platelets: 268 10*3/uL (ref 150–400)
RBC: 4.92 MIL/uL (ref 3.87–5.11)
RDW: 12.6 % (ref 11.5–15.5)
WBC: 9.7 10*3/uL (ref 4.0–10.5)
nRBC: 0 % (ref 0.0–0.2)

## 2021-03-21 LAB — I-STAT BETA HCG BLOOD, ED (MC, WL, AP ONLY): I-stat hCG, quantitative: <5 m[IU]/mL (ref <5)

## 2021-03-21 LAB — COMPREHENSIVE METABOLIC PANEL
ALT: 35 U/L (ref 0–44)
AST: 24 U/L (ref 15–41)
Albumin: 4.8 g/dL (ref 3.5–5.0)
Alkaline Phosphatase: 87 U/L (ref 38–126)
Anion gap: 10 (ref 5–15)
BUN: 10 mg/dL (ref 6–20)
CO2: 25 mmol/L (ref 22–32)
Calcium: 9.9 mg/dL (ref 8.9–10.3)
Chloride: 101 mmol/L (ref 98–111)
Creatinine, Ser: 0.59 mg/dL (ref 0.44–1.00)
GFR, Estimated: >60 mL/min (ref >60)
Glucose, Bld: 99 mg/dL (ref 70–99)
Potassium: 4.2 mmol/L (ref 3.5–5.1)
Sodium: 136 mmol/L (ref 135–145)
Total Bilirubin: 0.7 mg/dL (ref 0.3–1.2)
Total Protein: 8.2 g/dL — ABNORMAL HIGH (ref 6.5–8.1)

## 2021-03-21 LAB — LIPASE, BLOOD: Lipase: 22 U/L (ref 11–51)

## 2021-03-21 MED ORDER — ONDANSETRON 4 MG PO TBDP
4.0000 mg | ORAL_TABLET | Freq: Once | ORAL | Status: AC
  Administered 2021-03-21: 4 mg via ORAL
  Filled 2021-03-21: qty 1

## 2021-03-21 NOTE — ED Triage Notes (Signed)
Pt [facility] from doctors office. Pt reports being hit in the side with pallet on 12/7, and was originally seen for LUQ pain and vaginal bleeding. Pt reports continued abdominal pain and vaginal bleeding, but vaginal bleeding has subsided.

## 2021-03-21 NOTE — ED Provider Notes (Signed)
Emergency Medicine Provider Triage Evaluation Note  Chelsey Kemp , a 30 y.o. female  was evaluated in triage.  Pt complains of left flank pain and nausea.  Patient reports pain is similar and in the same location as it was last week when she was struck by a pallet and was evaluated in this emergency room including with a CT abdomen pelvis.  She reports today she took her prescribed medicines without significant relief.  She also endorses vaginal bleeding but that is an ongoing issue which she states has been evaluated by her PCP.  Review of Systems  Positive: Abdominal pain, left flank pain, vaginal bleeding Negative: Fever, constipation, dysuria  Physical Exam  BP 123/79 (BP Location: Left Arm)   Pulse 80   Temp (!) 97.3 F (36.3 C) (Oral)   Resp 16   SpO2 100%  Gen:   Awake, no distress   Resp:  Normal effort  MSK:   Moves extremities without difficulty  Other:    Medical Decision Making  Medically screening exam initiated at 5:32 PM.  Appropriate orders placed.  Renae Jeanlouis was informed that the remainder of the evaluation will be completed by another provider, this initial triage assessment does not replace that evaluation, and the importance of remaining in the ED until their evaluation is complete.     Marita [place], PA-C 03/21/21 1733    Cheryll Cockayne, MD 03/21/21 2350

## 2021-03-22 ENCOUNTER — Emergency Department (HOSPITAL_COMMUNITY): Payer: Medicaid - Out of State

## 2021-03-22 MED ORDER — OXYCODONE-ACETAMINOPHEN 5-325 MG PO TABS
1.0000 | ORAL_TABLET | Freq: Once | ORAL | Status: AC
  Administered 2021-03-22: 1 via ORAL
  Filled 2021-03-22: qty 1

## 2021-03-22 MED ORDER — KETOROLAC TROMETHAMINE 60 MG/2ML IM SOLN
60.0000 mg | Freq: Once | INTRAMUSCULAR | Status: AC
  Administered 2021-03-22: 60 mg via INTRAMUSCULAR
  Filled 2021-03-22: qty 2

## 2021-03-22 NOTE — ED Provider Notes (Signed)
[facility]-EMERGENCY DEPT Provider Note   CSN: [id number] Arrival date & time: 03/21/21  1659     History Chief Complaint  Patient presents with   Abdominal Pain    Delania Dobbin is a 30 y.o. female.  Patient presents to the emergency department for evaluation of left flank pain.  This has been an ongoing issue.  Patient reports that she was struck on her side at work by a pallet and has been having pain ever since.  Patient has been to urgent care twice in this emergency department once for this.  Patient reports that she took the pain medication but it did not help.      Past Medical History:  Diagnosis Date   Gastritis    Pre-eclampsia     Patient Active Problem List   Diagnosis Date Noted   Gall stones, common bile duct    Choledocholithiasis with obstruction 03/04/2016   Gastritis    Non-intractable vomiting with nausea    Elevated LFTs    Pain of upper abdomen    Acute cholecystitis s/p lap cholecystectomy 03/04/2016 03/02/2016    Past Surgical History:  Procedure Laterality Date   CESAREAN SECTION     CHOLECYSTECTOMY N/A 03/04/2016   Procedure: LAPAROSCOPIC CHOLECYSTECTOMY WITH INTRAOPERATIVE CHOLANGIOGRAM;  Surgeon: Gaynelle Adu, MD;  Location: [facility];  Service: General;  Laterality: N/A;   ERCP N/A 03/05/2016   Procedure: ENDOSCOPIC RETROGRADE CHOLANGIOPANCREATOGRAPHY (ERCP);  Surgeon: Dorena Cookey, MD;  Location: [facility];  Service: Endoscopy;  Laterality: N/A;   LAPAROSCOPIC CHOLECYSTECTOMY W/ CHOLANGIOGRAPHY  03/04/2016   Dr Gaynelle Adu     OB History     Gravida  2   Para  1   Term      Preterm      AB  1   Living  1      SAB  1   IAB      Ectopic      Multiple      Live Births              Family History  Problem Relation Age of Onset   Diabetes Maternal Grandmother    Hypertension Maternal Grandfather     Social History   Tobacco Use   Smoking status: Never   Smokeless tobacco: Never   Vaping Use   Vaping Use: Never used  Substance Use Topics   Alcohol use: No   Drug use: Not Currently    Home Medications Prior to Admission medications   Medication Sig Start Date End Date Taking? Authorizing Provider  acetaminophen (TYLENOL) 325 MG tablet Take 2 tablets (650 mg total) by mouth every 6 (six) hours as needed for mild pain. 03/07/16   Sherrie George, PA-C  ibuprofen (ADVIL) 800 MG tablet Take 1 tablet (800 mg total) by mouth 3 (three) times daily. 03/16/21   Garlon Hatchet, PA-C  methocarbamol (ROBAXIN) 500 MG tablet Take 1 tablet (500 mg total) by mouth 2 (two) times daily. 03/16/21   Garlon Hatchet, PA-C    Allergies    Patient has no known allergies.  Review of Systems   Review of Systems  Musculoskeletal:        Left flank and chest wall pain  All other systems reviewed and are negative.  Physical Exam Updated Vital Signs BP 119/67 (BP Location: Right Arm)   Pulse 83   Temp (!) 97.3 F (36.3 C) (Oral)   Resp 18   SpO2 100%  Physical Exam Vitals and nursing note reviewed.  Constitutional:      General: She is not in acute distress.    Appearance: Normal appearance. She is well-developed.  HENT:     Head: Normocephalic and atraumatic.     Right Ear: Hearing normal.     Left Ear: Hearing normal.     Nose: Nose normal.  Eyes:     Conjunctiva/sclera: Conjunctivae normal.     Pupils: Pupils are equal, round, and reactive to light.  Cardiovascular:     Rate and Rhythm: Regular rhythm.     Heart sounds: S1 normal and S2 normal. No murmur heard.   No friction rub. No gallop.  Pulmonary:     Effort: Pulmonary effort is normal. No respiratory distress.     Breath sounds: Normal breath sounds.  Chest:     Chest wall: No tenderness.  Abdominal:     General: Bowel sounds are normal.     Palpations: Abdomen is soft.     Tenderness: There is no abdominal tenderness. There is no guarding or rebound. Negative signs include Murphy's sign and McBurney's  sign.     Hernia: No hernia is present.  Musculoskeletal:        General: Normal range of motion.       Arms:     Cervical back: Normal range of motion and neck supple.  Skin:    General: Skin is warm and dry.     Findings: No rash.  Neurological:     Mental Status: She is alert and oriented to person, place, and time.     GCS: GCS eye subscore is 4. GCS verbal subscore is 5. GCS motor subscore is 6.     Cranial Nerves: No cranial nerve deficit.     Sensory: No sensory deficit.     Coordination: Coordination normal.  Psychiatric:        Speech: Speech normal.        Behavior: Behavior normal.        Thought Content: Thought content normal.    ED Results / Procedures / Treatments   Labs (all labs ordered are listed, but only abnormal results are displayed) Labs Reviewed  COMPREHENSIVE METABOLIC PANEL - Abnormal; Notable for the following components:      Result Value   Total Protein 8.2 (*)    All other components within normal limits  CBC  LIPASE, BLOOD  I-STAT BETA HCG BLOOD, ED ([facility], [facility], [facility] ONLY)    EKG None  Radiology DG Ribs Unilateral W/Chest Left  Result Date: 03/22/2021 CLINICAL DATA:  Left rib pain EXAM: LEFT RIBS AND CHEST - 3+ VIEW COMPARISON:  03/16/2021 FINDINGS: Lungs are clear.  No pleural effusion or pneumothorax. The heart is normal in size. No displaced left rib fracture is seen. Cholecystectomy clips. IMPRESSION: Negative. Electronically Signed   By: Charline Bills M.D.   On: 03/22/2021 01:07    Procedures Procedures   Medications Ordered in ED Medications  ondansetron (ZOFRAN-ODT) disintegrating tablet 4 mg (4 mg Oral Given 03/21/21 2351)  ketorolac (TORADOL) injection 60 mg (60 mg Intramuscular Given 03/22/21 0147)  oxyCODONE-acetaminophen (PERCOCET/ROXICET) 5-325 MG per tablet 1 tablet (1 tablet Oral Given 03/22/21 0151)    ED Course  I have reviewed the triage vital signs and the nursing notes.  Pertinent labs & imaging results that  were available during my care of the patient were reviewed by me and considered in my medical decision making (see chart for details).  MDM Rules/Calculators/A&P                           Patient presents to the emergency department for persistent left-sided back and flank pain.  Patient has been worked up for this previously.  Pain began when she was struck on the exact area where she is experiencing pain by a pallet at work.  She has previously been seen in the emergency department and had CT scans.  Blood work today is unremarkable.  Chest x-ray does not show any pneumothorax or other lung pathology.  I do not see any reason for any further work-up in the patient, she appears well.  There are not even any signs of external bruising, abrasions or any external sign of trauma. Final Clinical Impression(s) / ED Diagnoses Final diagnoses:  Contusion of flank and back, subsequent encounter    Rx / DC Orders ED Discharge Orders     None        Gervase Colberg, Canary Brim, MD 03/22/21 [phone]

## 2021-05-30 DIAGNOSIS — Z0289 Encounter for other administrative examinations: Secondary | ICD-10-CM

## 2022-03-03 IMAGING — CT CT ABD-PELV W/ CM
2 of 4 series · 16 of 46 positions shown, 18 images · IV contrast (omnipaque)
Comparison: CT 03/02/2016

CLINICAL DATA: Trauma left sided abdomen and flank pain

EXAM:
CT ABDOMEN AND PELVIS WITH CONTRAST
TECHNIQUE: Multidetector CT imaging of the abdomen and pelvis was performed
using the standard protocol following bolus administration of
intravenous contrast.
CONTRAST:  80mL OMNIPAQUE IOHEXOL 350 MG/ML SOLN

[Series 2: axial st · axial · 0.72mm/px · z∈[-630,-200]mm · 13 of 98 slices shown, 15 images]
[im 6/98  soft-tissue]
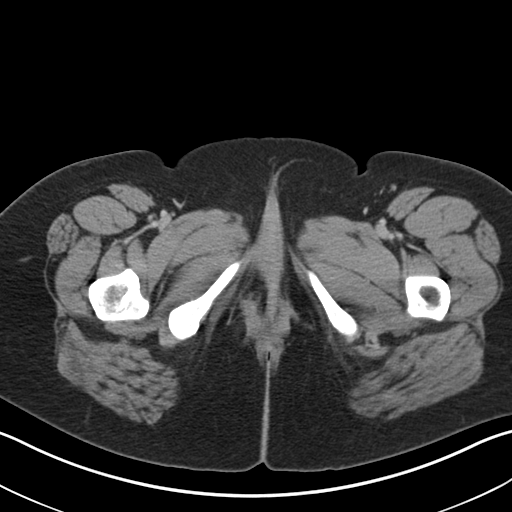
[im 6/98  bone]
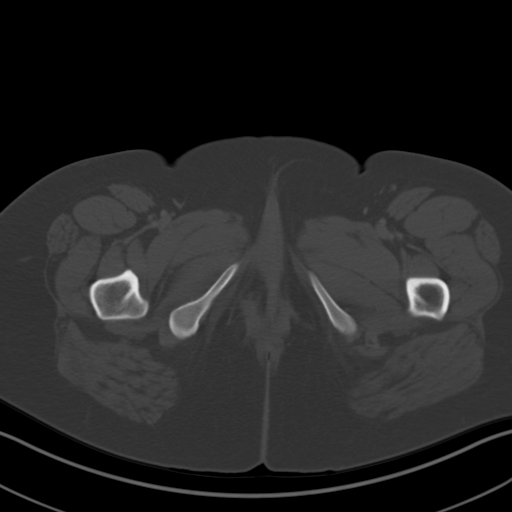
[im 16/98  soft-tissue]
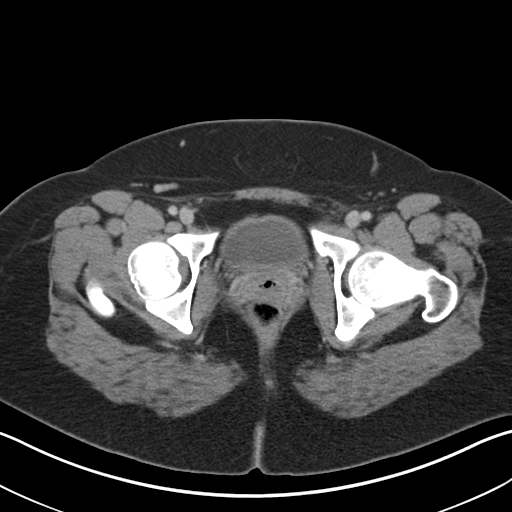
[im 21/98  soft-tissue]
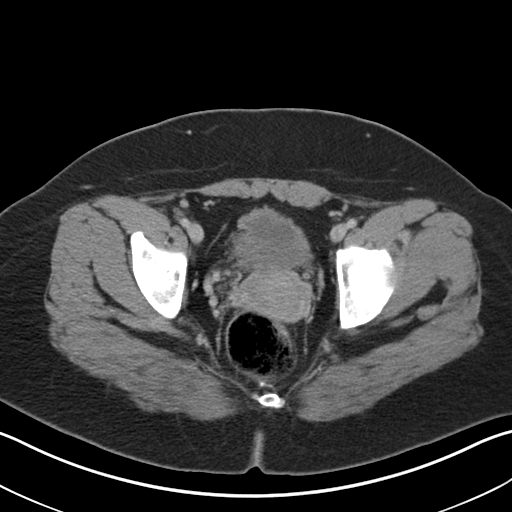
[im 26/98  soft-tissue]
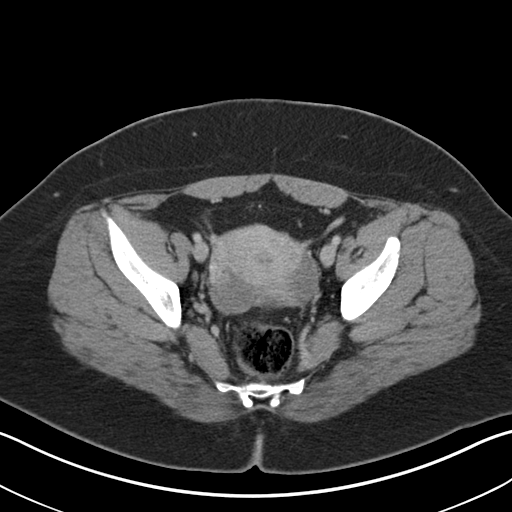
[im 36/98  soft-tissue]
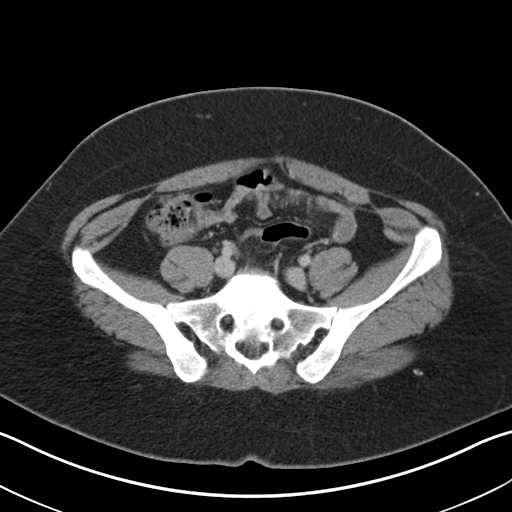
[im 41/98  soft-tissue]
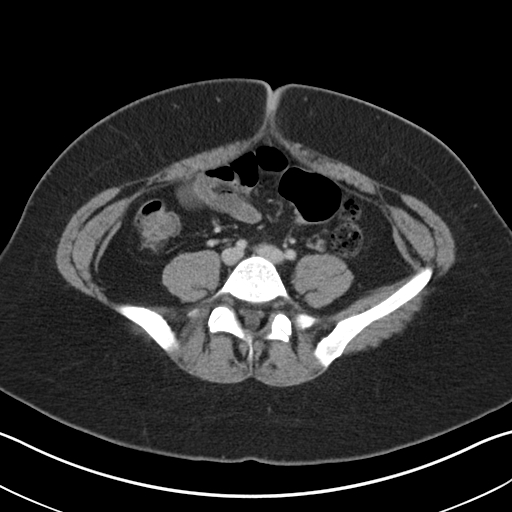
[im 52/98  soft-tissue]
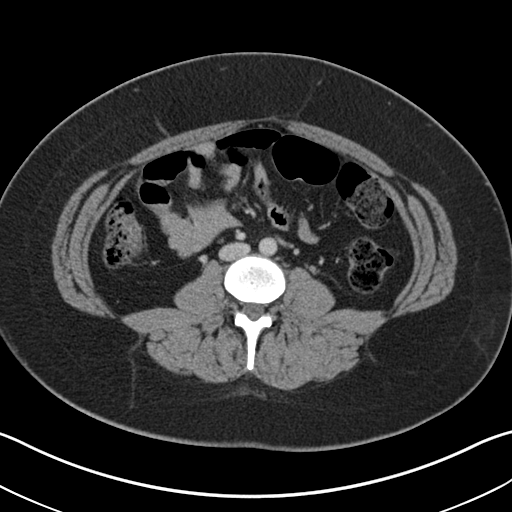
[im 57/98  soft-tissue]
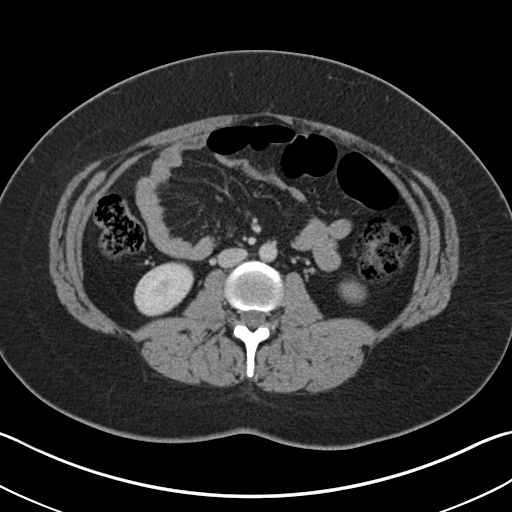
[im 62/98  soft-tissue]
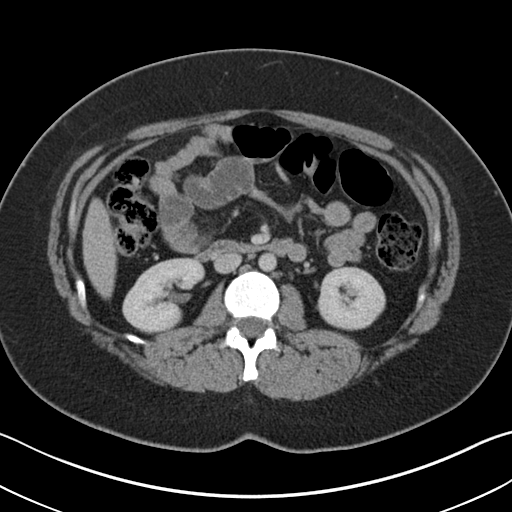
[im 62/98  bone]
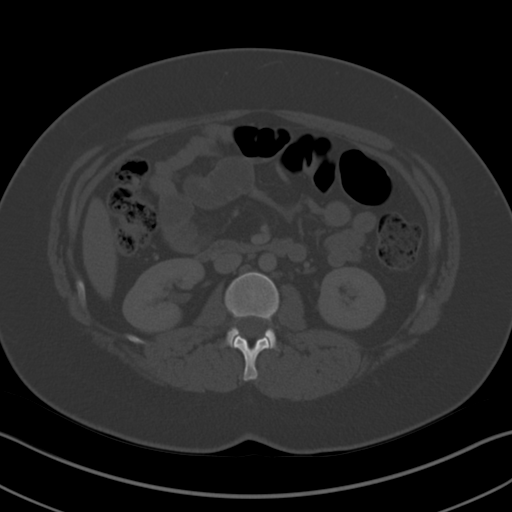
[im 72/98  soft-tissue]
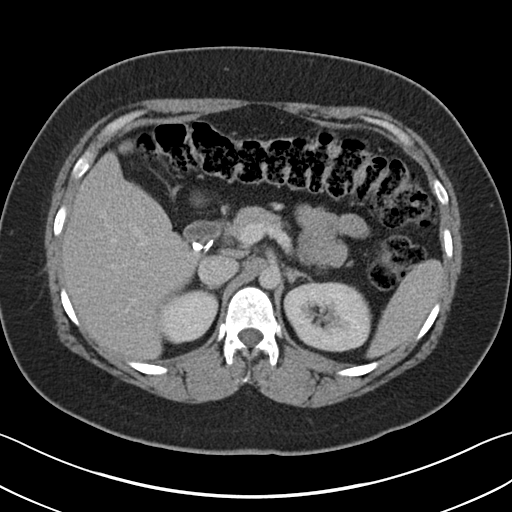
[im 77/98  soft-tissue]
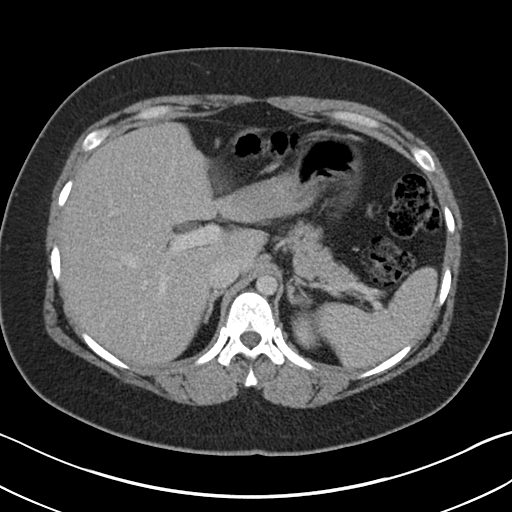
[im 82/98  soft-tissue]
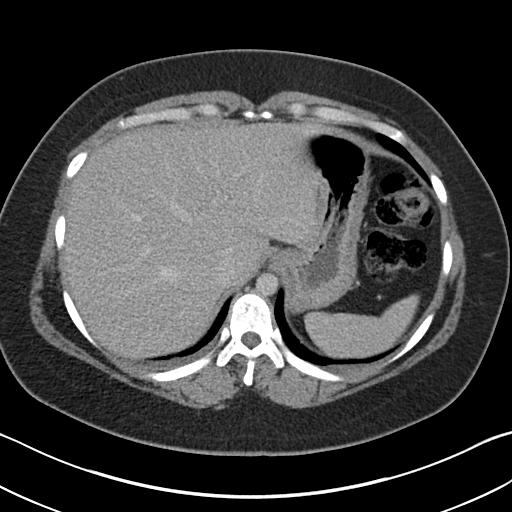
[im 92/98  soft-tissue]
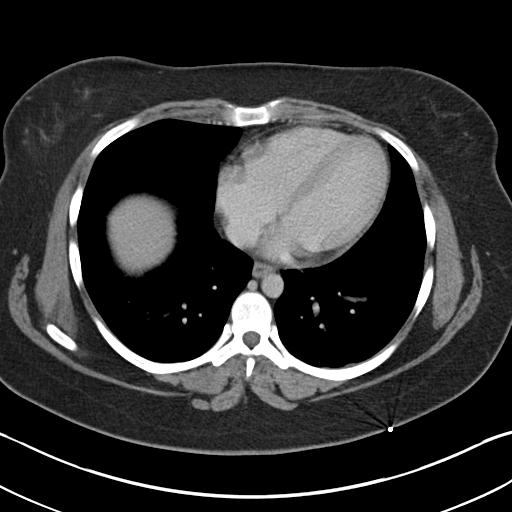

[Series 5: coronal st · coronal · 0.77mm/px · 3 of 151 slices shown]
[im 51/151  soft-tissue]
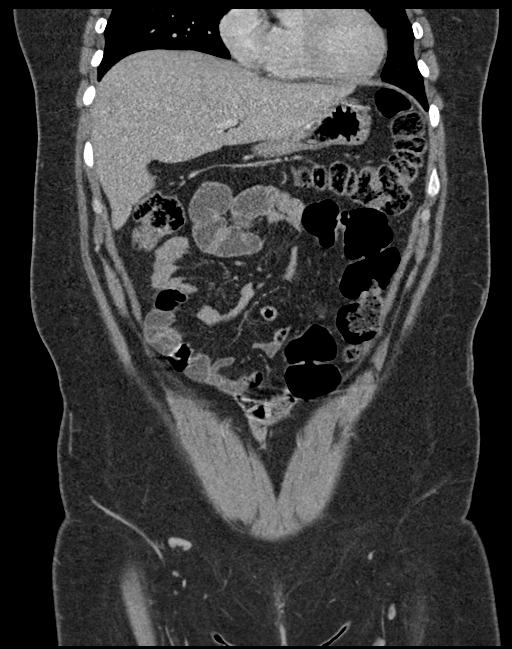
[im 67/151  soft-tissue]
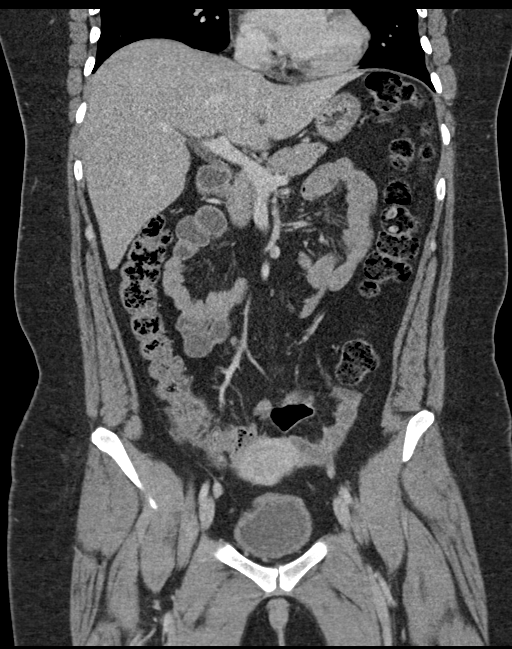
[im 84/151  soft-tissue]
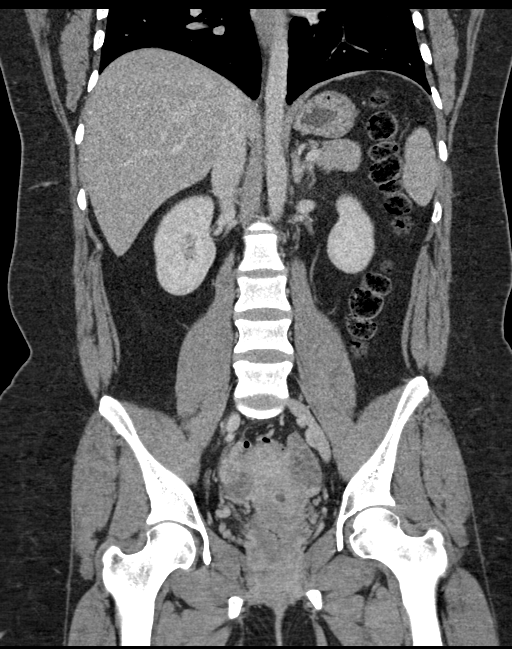

[16 of 46 positions shown; findings below may reference images not displayed]

FINDINGS: Lower chest: No acute abnormality.

Hepatobiliary: No focal liver abnormality is seen. Status post
cholecystectomy. No biliary dilatation.

Pancreas: Unremarkable. No pancreatic ductal dilatation or
surrounding inflammatory changes.

Spleen: Normal in size without focal abnormality.

Adrenals/Urinary Tract: Adrenal glands are unremarkable. Kidneys are
normal, without renal calculi, focal lesion, or hydronephrosis.
Bladder is unremarkable.

Stomach/Bowel: Stomach is within normal limits. Appendix appears
normal. No evidence of bowel wall thickening, distention, or
inflammatory changes.

Vascular/Lymphatic: No significant vascular findings are present. No
enlarged abdominal or pelvic lymph nodes.

Reproductive: Uterus and bilateral adnexa are unremarkable.

Other: No abdominal wall hernia or abnormality. No abdominopelvic
ascites.

Musculoskeletal: No acute or significant osseous findings.
IMPRESSION: No CT evidence for acute intra-abdominal or intrapelvic abnormality.

## 2022-03-09 IMAGING — CR DG RIBS W/ CHEST 3+V*L*
3 series · 3 of 3 positions shown · non-contrast
Comparison: 03/16/2021

CLINICAL DATA: Left rib pain

EXAM:
LEFT RIBS AND CHEST - 3+ VIEW

[w chest pa]
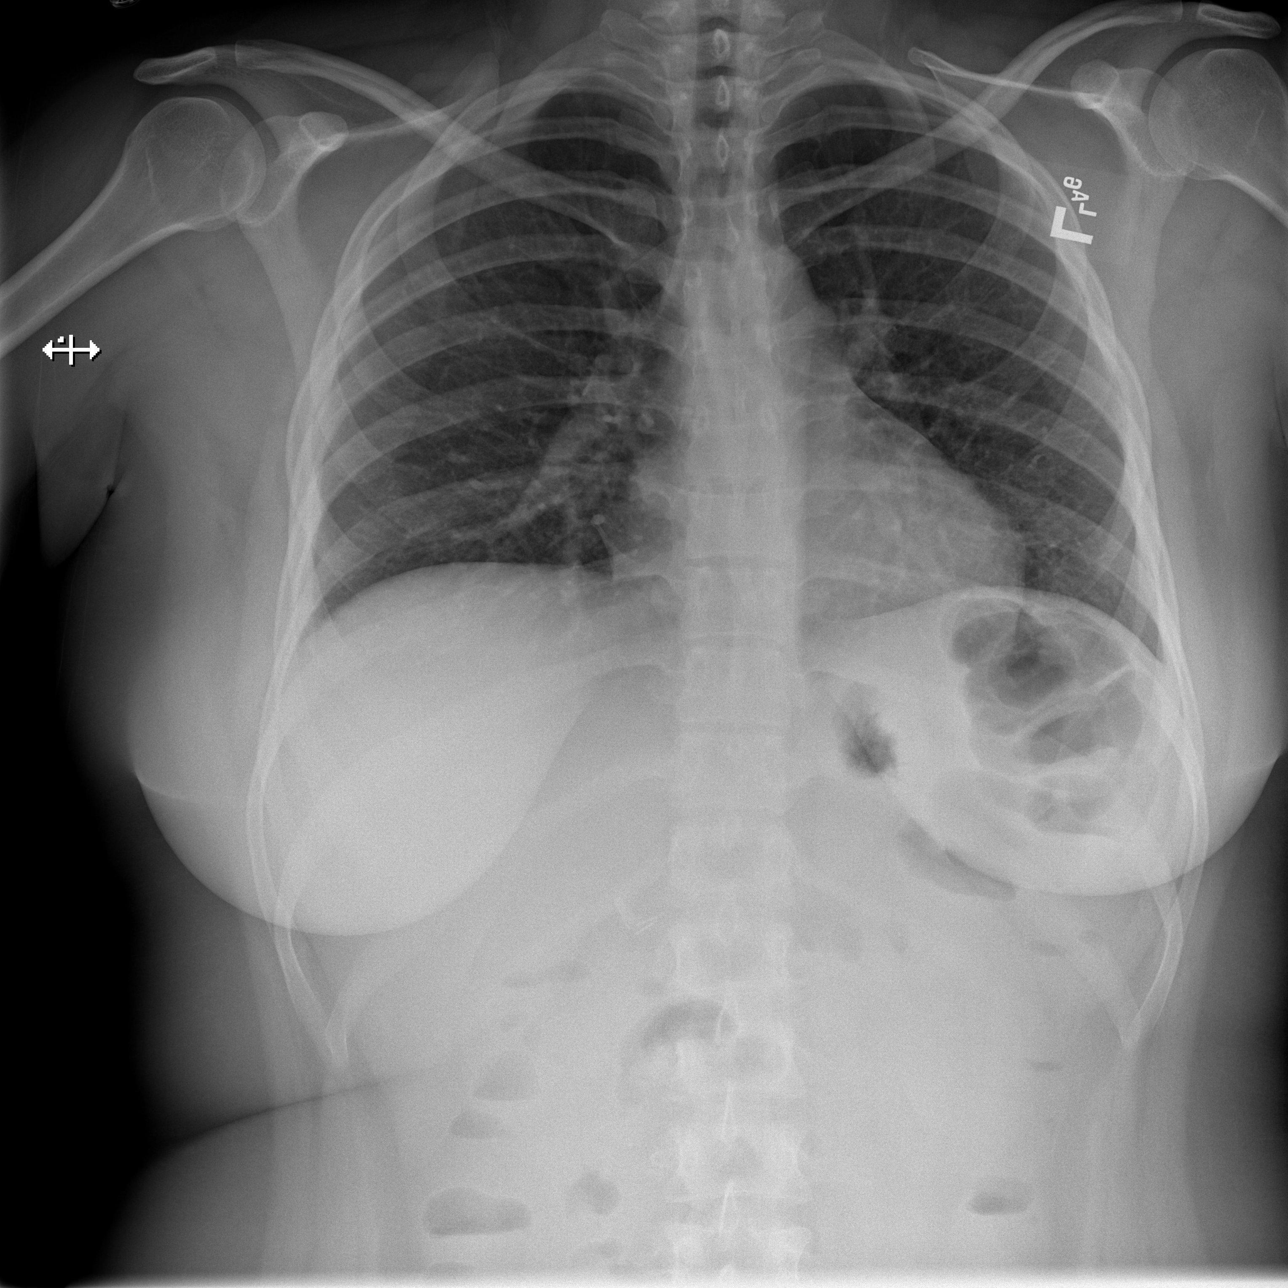

[w ribs obl left (1 of 2)]
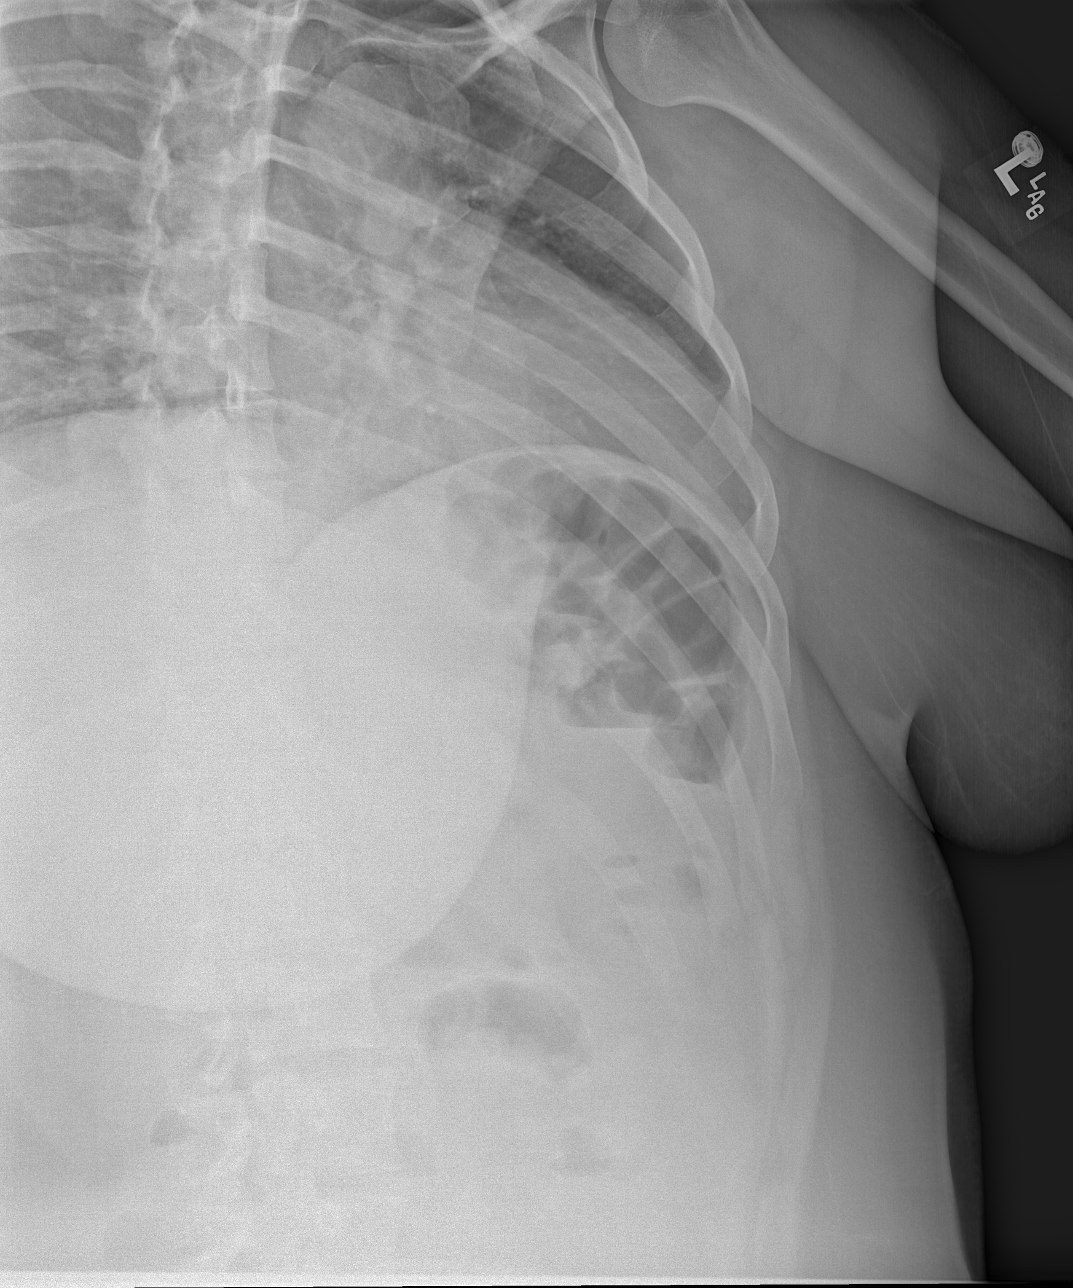

[w ribs obl left (2 of 2)]
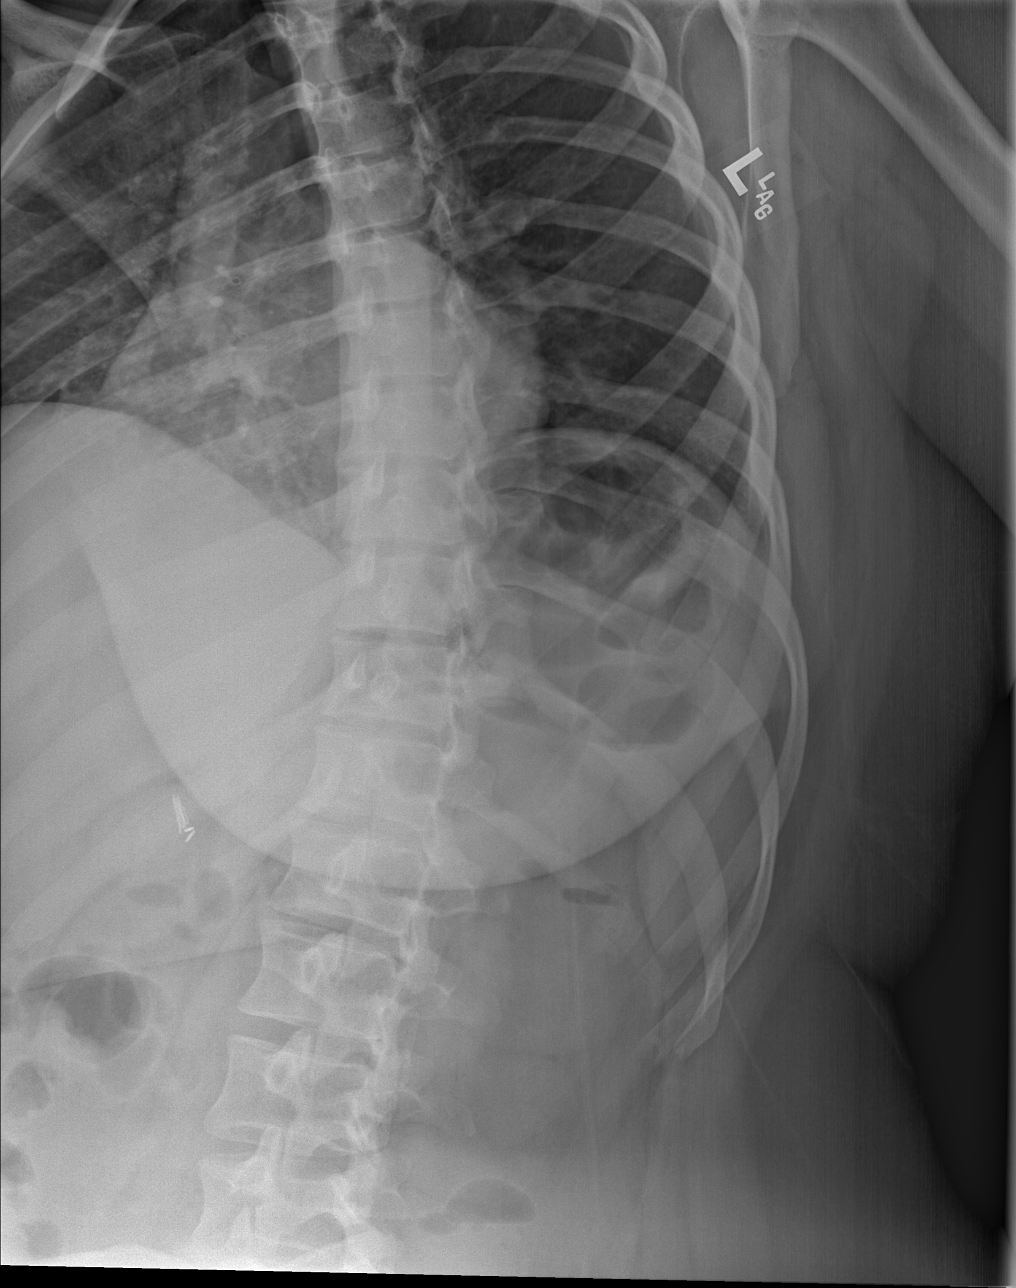

[3 of 3 positions shown; findings below may reference images not displayed]

FINDINGS: Lungs are clear.  No pleural effusion or pneumothorax.

The heart is normal in size.

No displaced left rib fracture is seen.

Cholecystectomy clips.
IMPRESSION: Negative.
